# Patient Record
Sex: Female | Born: 1965 | Race: White | Hispanic: No | Marital: Married | Smoking: Never smoker
Health system: Southern US, Community
[De-identification: ages and names within clinical notes are randomized; demographics above are authoritative.]

## PROBLEM LIST (undated history)

## (undated) DIAGNOSIS — M47812 Spondylosis without myelopathy or radiculopathy, cervical region: Secondary | ICD-10-CM

## (undated) DIAGNOSIS — N200 Calculus of kidney: Secondary | ICD-10-CM

## (undated) DIAGNOSIS — M1990 Unspecified osteoarthritis, unspecified site: Secondary | ICD-10-CM

## (undated) DIAGNOSIS — F419 Anxiety disorder, unspecified: Secondary | ICD-10-CM

## (undated) HISTORY — DX: Calculus of kidney: N20.0

## (undated) HISTORY — DX: Spondylosis without myelopathy or radiculopathy, cervical region: M47.812

## (undated) HISTORY — DX: Unspecified osteoarthritis, unspecified site: M19.90

## (undated) HISTORY — DX: Anxiety disorder, unspecified: F41.9

## (undated) HISTORY — PX: TONSILLECTOMY: SUR1361

---

## 1998-07-03 ENCOUNTER — Inpatient Hospital Stay (HOSPITAL_COMMUNITY): Admission: AD | Admit: 1998-07-03 | Discharge: 1998-07-03 | Payer: Self-pay | Admitting: Family Medicine

## 1998-09-22 ENCOUNTER — Other Ambulatory Visit: Admission: RE | Admit: 1998-09-22 | Discharge: 1998-09-22 | Payer: Self-pay | Admitting: Obstetrics and Gynecology

## 1998-09-27 ENCOUNTER — Inpatient Hospital Stay (HOSPITAL_COMMUNITY): Admission: AD | Admit: 1998-09-27 | Discharge: 1998-09-29 | Payer: Self-pay | Admitting: Obstetrics & Gynecology

## 1999-03-20 ENCOUNTER — Emergency Department (HOSPITAL_COMMUNITY): Admission: EM | Admit: 1999-03-20 | Discharge: 1999-03-20 | Payer: Self-pay | Admitting: Emergency Medicine

## 1999-07-15 ENCOUNTER — Other Ambulatory Visit: Admission: RE | Admit: 1999-07-15 | Discharge: 1999-07-15 | Payer: Self-pay | Admitting: Obstetrics and Gynecology

## 2000-09-23 ENCOUNTER — Inpatient Hospital Stay (HOSPITAL_COMMUNITY): Admission: AD | Admit: 2000-09-23 | Discharge: 2000-09-23 | Payer: Self-pay | Admitting: Obstetrics and Gynecology

## 2000-10-26 ENCOUNTER — Inpatient Hospital Stay (HOSPITAL_COMMUNITY): Admission: AD | Admit: 2000-10-26 | Discharge: 2000-10-27 | Payer: Self-pay | Admitting: Obstetrics & Gynecology

## 2001-03-27 ENCOUNTER — Other Ambulatory Visit: Admission: RE | Admit: 2001-03-27 | Discharge: 2001-03-27 | Payer: Self-pay | Admitting: Obstetrics and Gynecology

## 2001-03-27 ENCOUNTER — Encounter (INDEPENDENT_AMBULATORY_CARE_PROVIDER_SITE_OTHER): Payer: Self-pay

## 2001-11-06 HISTORY — PX: ABDOMINAL HYSTERECTOMY: SHX81

## 2001-11-06 HISTORY — PX: BLADDER SUSPENSION: SHX72

## 2002-05-20 ENCOUNTER — Other Ambulatory Visit: Admission: RE | Admit: 2002-05-20 | Discharge: 2002-05-20 | Payer: Self-pay | Admitting: Obstetrics and Gynecology

## 2002-09-15 ENCOUNTER — Encounter (INDEPENDENT_AMBULATORY_CARE_PROVIDER_SITE_OTHER): Payer: Self-pay | Admitting: *Deleted

## 2002-09-15 ENCOUNTER — Inpatient Hospital Stay (HOSPITAL_COMMUNITY): Admission: RE | Admit: 2002-09-15 | Discharge: 2002-09-17 | Payer: Self-pay | Admitting: Obstetrics and Gynecology

## 2002-11-29 ENCOUNTER — Inpatient Hospital Stay (HOSPITAL_COMMUNITY): Admission: AD | Admit: 2002-11-29 | Discharge: 2002-11-29 | Payer: Self-pay | Admitting: Obstetrics and Gynecology

## 2002-12-14 ENCOUNTER — Inpatient Hospital Stay (HOSPITAL_COMMUNITY): Admission: AD | Admit: 2002-12-14 | Discharge: 2002-12-14 | Payer: Self-pay | Admitting: Obstetrics and Gynecology

## 2003-05-05 ENCOUNTER — Encounter (INDEPENDENT_AMBULATORY_CARE_PROVIDER_SITE_OTHER): Payer: Self-pay | Admitting: Specialist

## 2003-05-05 ENCOUNTER — Ambulatory Visit (HOSPITAL_BASED_OUTPATIENT_CLINIC_OR_DEPARTMENT_OTHER): Admission: RE | Admit: 2003-05-05 | Discharge: 2003-05-05 | Payer: Self-pay | Admitting: Otolaryngology

## 2004-08-05 ENCOUNTER — Encounter: Admission: RE | Admit: 2004-08-05 | Discharge: 2004-08-05 | Payer: Self-pay | Admitting: Family Medicine

## 2004-08-08 ENCOUNTER — Ambulatory Visit (HOSPITAL_COMMUNITY): Admission: RE | Admit: 2004-08-08 | Discharge: 2004-08-08 | Payer: Self-pay | Admitting: Family Medicine

## 2004-08-12 ENCOUNTER — Other Ambulatory Visit: Admission: RE | Admit: 2004-08-12 | Discharge: 2004-08-12 | Payer: Self-pay | Admitting: Obstetrics and Gynecology

## 2005-12-23 IMAGING — NM NM HEPATO W/GB/PHARM/[PERSON_NAME]
2 series · 12 of 12 positions shown · non-contrast
Comparison: none

CLINICAL DATA: Right upper quadrant pain after meal.  Suspect gallbladder disease. 
NUCLEAR MEDICINE HEPATOBILIARY WITH EJECTION FRACTION DETERMINATION
The patient was given 5.0 mCi of Technetium 99m tagged Choletec and anterior gamma camera views obtained in the right upper quadrant. The gallbladder is faintly seen at 10 minutes with increase in activity up to 50 minutes.  is noted in the small bowel at 40 minutes.  At one hour, the patient was given 1.2 mcg of CCK intravenously and ejection fraction determination was performed.  At 30 minutes, there is 73 percent ejection fraction noted with 71 percent at 60 minutes.  This is well within normal limits with normal value greater than 35 percent ejected at 30 minutes as the standard.  
IMPRESSION 
As above.

[he hepatobiliary · 3.22mm/px · 6 of 10 frames shown (1 of 2)]
[frame 1/10]
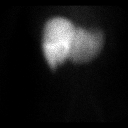
[frame 3/10]
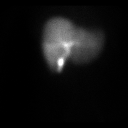
[frame 5/10]
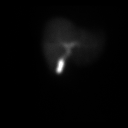
[frame 6/10]
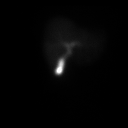
[frame 8/10]
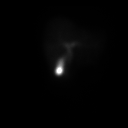
[frame 10/10]
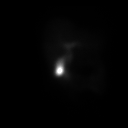

[he hepatobiliary · 3.22mm/px · 6 of 60 frames shown (2 of 2)]
[frame 6/60]
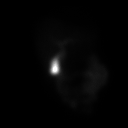
[frame 16/60]
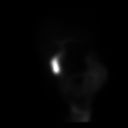
[frame 26/60]
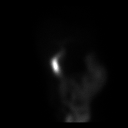
[frame 36/60]
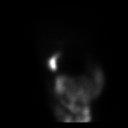
[frame 46/60]
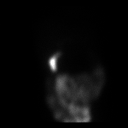
[frame 56/60]
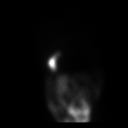

[12 of 12 positions shown; findings below may reference images not displayed]

## 2006-02-02 ENCOUNTER — Ambulatory Visit: Payer: Self-pay | Admitting: Internal Medicine

## 2006-05-28 ENCOUNTER — Ambulatory Visit: Payer: Self-pay | Admitting: Family Medicine

## 2008-03-10 DIAGNOSIS — L255 Unspecified contact dermatitis due to plants, except food: Secondary | ICD-10-CM | POA: Insufficient documentation

## 2008-03-13 ENCOUNTER — Ambulatory Visit: Payer: Self-pay | Admitting: Family Medicine

## 2008-08-17 ENCOUNTER — Ambulatory Visit: Payer: Self-pay | Admitting: Family Medicine

## 2008-08-17 ENCOUNTER — Ambulatory Visit: Payer: Self-pay | Admitting: Cardiology

## 2008-08-17 DIAGNOSIS — Z87442 Personal history of urinary calculi: Secondary | ICD-10-CM | POA: Insufficient documentation

## 2008-08-17 LAB — CONVERTED CEMR LAB
Bilirubin Urine: NEGATIVE
Blood in Urine, dipstick: NEGATIVE
Glucose, Urine, Semiquant: NEGATIVE
Ketones, urine, test strip: NEGATIVE
Nitrite: NEGATIVE
Protein, U semiquant: NEGATIVE
Specific Gravity, Urine: 1.005
Urobilinogen, UA: 0.2
WBC Urine, dipstick: NEGATIVE
pH: 8

## 2008-08-20 ENCOUNTER — Ambulatory Visit: Payer: Self-pay | Admitting: Family Medicine

## 2008-08-20 LAB — CONVERTED CEMR LAB
Bilirubin Urine: NEGATIVE
Blood in Urine, dipstick: NEGATIVE
Glucose, Urine, Semiquant: NEGATIVE
Ketones, urine, test strip: NEGATIVE
Nitrite: NEGATIVE
Protein, U semiquant: NEGATIVE
Specific Gravity, Urine: 1.015
Urobilinogen, UA: 0.2
WBC Urine, dipstick: NEGATIVE
pH: 6.5

## 2008-08-26 ENCOUNTER — Telehealth: Payer: Self-pay | Admitting: Family Medicine

## 2011-01-31 ENCOUNTER — Ambulatory Visit (INDEPENDENT_AMBULATORY_CARE_PROVIDER_SITE_OTHER): Payer: 59 | Admitting: Family Medicine

## 2011-01-31 ENCOUNTER — Encounter: Payer: Self-pay | Admitting: Family Medicine

## 2011-01-31 VITALS — BP 130/92 | Temp 98.4°F | Ht 67.0 in | Wt 150.0 lb

## 2011-01-31 DIAGNOSIS — T25029A Burn of unspecified degree of unspecified foot, initial encounter: Secondary | ICD-10-CM

## 2011-01-31 MED ORDER — OXYCODONE-ACETAMINOPHEN 5-325 MG PO TABS: 1.0000 | ORAL_TABLET | ORAL | Status: AC | PRN
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
## 2011-02-03 ENCOUNTER — Telehealth: Payer: Self-pay | Admitting: *Deleted

## 2011-02-03 MED ORDER — SILVER SULFADIAZINE 1 % EX CREA: TOPICAL_CREAM | Freq: Every day | CUTANEOUS | Status: DC

## 2011-02-03 NOTE — Telephone Encounter (Signed)
rx called in and patient is aware 

## 2011-02-08 ENCOUNTER — Telehealth: Payer: Self-pay | Admitting: *Deleted
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
## 2011-02-15 ENCOUNTER — Other Ambulatory Visit: Payer: Self-pay | Admitting: Family Medicine

## 2011-02-16 NOTE — Telephone Encounter (Signed)
Is this okay to fill? 
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
## 2011-02-28 ENCOUNTER — Other Ambulatory Visit: Payer: Self-pay | Admitting: Obstetrics and Gynecology

## 2011-02-28 DIAGNOSIS — R928 Other abnormal and inconclusive findings on diagnostic imaging of breast: Secondary | ICD-10-CM

## 2011-03-02 ENCOUNTER — Ambulatory Visit
Admission: RE | Admit: 2011-03-02 | Discharge: 2011-03-02 | Disposition: A | Discharge status: Home / Self Care | Payer: 59 | Source: Ambulatory Visit | Attending: Obstetrics and Gynecology | Admitting: Obstetrics and Gynecology

## 2011-03-02 DIAGNOSIS — R928 Other abnormal and inconclusive findings on diagnostic imaging of breast: Secondary | ICD-10-CM

## 2011-03-24 NOTE — Discharge Summary (Signed)
[facility]  Patient:    Caitlin Horn, Caitlin Horn                          MRN: [id number] Adm. Date:  10/26/00 Disc. Date: 10/27/00 Attending:  Caralyn Guile. Arlyce Dice, M.D. Dictator:   Leilani Able, P.A.                           Discharge Summary  FINAL DIAGNOSES:              1. Intrauterine pregnancy at term.                               2. Spontaneous vaginal delivery of a female                                  infant with Apgars of 9 and 9.  HOSPITAL COURSE:              This 46 year old G3, P1 presents at 40-5/[redacted] weeks gestation in active labor.  The patients prenatal course was complicated by advanced maternal age, and an amniocentesis was performed at 8 weeks, showing a female with normal chromosomes.  The rest of the patients prenatal course has been uncomplicated.  She is admitted at this time.  The patient had spontaneous vaginal delivery of an 8-pound 9-ounce female infant with Apgars of 9 and 9 over a second-degree peroneal laceration that was repaired.  Delivery went without complications.  The patients postpartum course was benign, without significant fevers.  She was felt ready for discharge on postpartum day #1.  Was sent home on a regular diet, told to decrease activities, was given Tylox #20 1-2 every four hours as needed for pain, also told she could use over-the-counter pain medicines.  To follow up in the office in four weeks. DD:  11/19/00 TD:  11/19/00 Job: [id number]

## 2011-03-24 NOTE — Op Note (Signed)
   NAME:  [name], [name]                           ACCOUNT NO.:  [id number]   MEDICAL RECORD NO.:  [id number]                   PATIENT TYPE:  AMB   LOCATION:  DSC                                  FACILITY:  [facility]   PHYSICIAN:  Christopher E. Ezzard Standing, M.D.         DATE OF BIRTH:  Aug 08, 1966   DATE OF PROCEDURE:  05/05/2003  DATE OF DISCHARGE:                                 OPERATIVE REPORT   PREOPERATIVE DIAGNOSIS:  Chronic cryptic tonsillitis.   POSTOPERATIVE DIAGNOSIS:  Chronic cryptic tonsillitis.   OPERATION:  Tonsillectomy.   SURGEON:  Kristine Garbe. Ezzard Standing, M.D.   ANESTHESIA:  General endotracheal.   COMPLICATIONS:  None.   BRIEF CLINICAL NOTE:  Caitlin Horn is a 45 year old female who has had  chronic problems with debris collecting in the tonsil crypts as well as  frequent sore throats.  The left side has been worse than the right.  On  examination, she has chronic cryptic tonsils, 2+.  She is taken to the  operating room at this time for tonsillectomy.   DESCRIPTION OF PROCEDURE:  After adequate endotracheal anesthesia, the  patient received 8 mg of Decadron IV preoperatively as well as 1 g of Ancef  IV preoperatively.  A mouthgag was used to expose the oropharynx.  The  tonsils were resected from the tonsillar fossae using the cautery.  Care was  taken to preserve the anterior and posterior tonsillar pillars as well as  the uvula.  Hemostasis was obtained with cautery.  After obtaining adequate  hemostasis, the tonsillar fossae were irrigated with saline.  This completed  the procedure.  Caitlin Horn was awoken from anesthesia and transferred to the  recovery room postoperatively doing well.   DISPOSITION:  Caitlin Horn is discharged home later this morning on amoxicillin  suspension 400 mg b.i.d. for one week, Tylenol and Lortab elixir 15-30 cc  q.4 h. p.r.n. pain.  I will have her follow up in my office in two weeks for  recheck.     Kristine Garbe. Ezzard Standing, M.D.    CEN/MEDQ  D:  05/05/2003  T:  05/05/2003  Job:  [id number]   cc:   Tinnie Gens A. Tawanna Cooler, M.D. [facility]

## 2011-03-24 NOTE — Op Note (Signed)
NAME:  Caitlin Horn, Caitlin Horn NO.:  [id number]   MEDICAL RECORD NO.:  [id number]                   PATIENT TYPE:  INP   LOCATION:  9399                                 FACILITY:  [facility]   PHYSICIAN:  Randye Lobo, M.D.                DATE OF BIRTH:  [birth year]   DATE OF PROCEDURE:  09/15/2002  DATE OF DISCHARGE:                                 OPERATIVE REPORT   SURGEON:  Randye Lobo, M.D.   ASSISTANT:  Miguel Aschoff, M.D.   PREOPERATIVE DIAGNOSES:  1. Genuine stress incontinence.  2. Cystourethrocele.  3. Rectocele.   POSTOPERATIVE DIAGNOSES:  1. Genuine stress incontinence.  2. Cystourethrocele.  3. Rectocele.   PROCEDURE:  1. TVT urethral suspension.  2. Anterior colporrhaphy.  3. Posterior colporrhaphy.  4. Cystoscopy.  5. Suprapubic catheter placement.   ANESTHESIA:  Spinal epidural.   TOTAL IV FLUIDS:  1650 cc.   ESTIMATED BLOOD LOSS:  150 cc.   URINE OUTPUT:  150 cc.   COMPLICATIONS:  None.   INDICATIONS FOR PROCEDURE:  The patient was a 45 year old para 2 Caucasian  female who presented with a four year history of urinary incontinence which  was a social problem for her.  The patient declined any future childbearing  and wished for definitive surgical treatment of her incontinence.  She did  undergo preoperative multichannel urodynamic testing which documented the  presence of genuine stress incontinence, a stable cystometrogram.  On  physical examination the patient was noted to have evidence of a second to  third degree cystocele, first to second degree uterine prolapse, and a first  degree rectocele in the office.   The patient's primary gynecologist, Dr. Miguel Aschoff, discussed options with  the patient regarding treatment for the pelvic organ prolapse and a decision  was made to proceed with a combined case with both surgeons involved in  treatment of the prolapse and the incontinence.  The patient agreed to her  surgery  after the risks, benefits, and alternatives were discussed with her.   FINDINGS:  Examination under anesthesia revealed a second to third degree  cystocele, second degree uterine prolapse, and a second degree rectocele.   Cystoscopy documented the absence of foreign bodies in the bladder or the  urethra.  The bladder was visualized at 360 degrees.  The bladder dome,  trigone, and urethra were all noted to be normal.  The ureters were patent  bilaterally after the injection of indigo carmine dye intravenously.   PROCEDURE:  The patient was taken to the operating room after she was  properly identified in the preoperative hold area.  In the operating room  suite the patient did receive a spinal epidural and was then placed in the  dorsal lithotomy position.  The patient was then sterilely prepped and  draped and the total vaginal hysterectomy was performed under the direction  of  Dr. Miguel Aschoff.  Please refer to this dictation separately.  After the  total vaginal hysterectomy with culdoplasty procedure I proceeded with the  remainder of the prolapse and urinary incontinence repair.   A Foley catheter was placed inside the bladder which was emptied of all  urine.  The posterior vaginal cuff had been closed with a running locked  suture and the peritoneum was closed in a purse-string type closure.   The anterior colporrhaphy was then performed with the TVT procedure.  Allis  clamps were placed on the vaginal mucosa marking the midline of the anterior  vaginal wall.  This was then injected with 0.5% lidocaine with 1:100,000 of  epinephrine.  The midline of the vagina was then incised in a vertical  fashion.  The subvaginal tissue was then dissected off of the underlying  bladder using a combination of sharp and blunt dissection.  The dissection  was carried through the pubic rami bilaterally.  This space of Retzius was  not entered at this time.   A small puncture incisions were then  created suprapubically to the right and  to the left of the midline using a scalpel.  The abdominal passer was then  placed through the space of Retzius and into the vagina below at the level  of the proximal mid urethra.  The same procedure that was performed on the  patient's right-hand side was repeated on the left-hand side.  The Foley  catheter was removed at this time and cystoscopy was performed.  The  findings were as noted above.  The TVT was then secured to the abdominal  passer in the appropriate fashion and the mesh was drawn up through the  suprapubic incision sites bilaterally.  The mesh was adjusted into the  proper location at this time and the cystoscopy was once again performed.  There was no evidence of any foreign body in the urethra or the bladder.  The surrounding plastic guide which covered the mesh was then removed and  the mesh was trimmed suprapubically.  The cystocele was next reduced by  placing vertical mattress sutures of 0 Vicryl for excellent reduction of the  cystocele.  The anterior vaginal wall including the site from the  hysterectomy was then closed using a running locked suture of 2-0 Vicryl.  The Foley catheter was replaced inside the urinary bladder at this time.   The posterior colporrhaphy was performed next.  Allis clamps marked the  hymen bilaterally and the midline of the vagina midway up to the vaginal  apex.  The subvaginal tissue was again injected with 0.5% lidocaine with  epinephrine.  A wedge shaped specimen of tissue was then sharply excised  from the perineal body and the vagina was incised in the midline using the  Mayo scissors.  The perirectal fascia was then dissected off of the vaginal  mucosa and the posterior colporrhaphy sutures of 0 Vicryl were placed in an  interrupted and in a vertical mattress fashion.  This provided excellent  reduction of the rectocele.  Excess vaginal mucosa was trimmed away and the posterior vagina was  then closed with a running locked suture of 2-0 Vicryl.  This was continued along the perineum for closure of an episiotomy.   At this time the bladder was retrograde filled with 500 cc of crystalloid  solution and the patient was placed in the Trendelenburg position.  A  suprapubic Bonanno catheter was then placed into the urinary bladder and a  syringe  was used to withdraw fluid to assure proper location in the bladder.  The suprapubic catheter was then secured to the skin with 3-0 Ethilon  suture.  The Foley catheter had been removed for placement of the suprapubic  catheter and was replaced at this time.  Both catheters were placed to  gravity drainage.  The suprapubic incisions were closed using Dermabond and  a sterile bandage was placed over the suprapubic catheter site.  An iodoform  gauze packing was placed inside the vagina and a rectal examination at this  time confirmed the absence of sutures in the rectum.   The patient was cleansed of any Betadine and was then escorted to the  recovery room in stable and awake condition.  There were no complications to  the procedure.  All needle, instrument, and sponge counts were correct.                                               Randye Lobo, M.D.    BES/MEDQ  D:  09/15/2002  T:  09/15/2002  Job:  [id number]

## 2011-03-24 NOTE — Op Note (Signed)
NAME:  Caitlin Horn, Caitlin Horn                           ACCOUNT NO.:  [id number]   MEDICAL RECORD NO.:  [id number]                   PATIENT TYPE:  INP   LOCATION:  9399                                 FACILITY:  [facility]   PHYSICIAN:  Miguel Aschoff, M.D.                    DATE OF BIRTH:  September 14, 1966   DATE OF PROCEDURE:  09/15/2002  DATE OF DISCHARGE:                                 OPERATIVE REPORT   PREOPERATIVE DIAGNOSES:  Uterine descensus.   POSTOPERATIVE DIAGNOSES:  Uterine descensus.   PROCEDURE:  Total vaginal hysterectomy.   SURGEON:  Miguel Aschoff, M.D.   ASSISTANT:  Randye Lobo, M.D.   ANESTHESIA:  Spinal and epidural.   COMPLICATIONS:  None.   JUSTIFICATION:  The patient is a 45 year old white female with a history of  uterine descensus.  Also noted to have a symptomatic urethrocele, cystocele,  and rectocele.  The patient has been evaluated by Dr. Conley Simmonds from a  urologic standpoint and is to have repair of the cystocele, rectocele, and  urethrocele following the hysterectomy.   PROCEDURE:  The patient was taken to the operating room and placed in supine  position after spinal anesthesia had been placed without difficulty along  with an epidural catheter.  She was placed in the dorsal lithotomy position,  prepped and draped in the usual sterile fashion.  The bladder was  catheterized.  Speculum was placed in the vaginal vault.  Cervix was then  injected with 1% Xylocaine with 1:100,000 of epinephrine.  The cervical  mucosa was then incised.  The uterine dissection was carried out anteriorly  and posteriorly until the peritoneal reflections were identified.  Peritoneum was entered posteriorly.  Once this was done using curved Heaney  clamps uterosacral ligaments were clamped, cut, and suture ligated using  suture ligatures of 0 Vicryl.  The cardinal ligaments were then clamped,  cut, and suture ligated in a similar fashion.  Additional bites were taken  of the broad  ligament structures with curved Heaney clamps.  All pedicles  were suture ligated using suture ligatures of 0 Vicryl.  The peritoneum was  then entered anteriorly, then the uterine vessels were identified, clamped,  cut, and suture ligated using suture ligatures of 0 Vicryl.  Additional  bites were taken of the broad ligament structures using curved Heaney  clamps, then all pedicles were suture ligated using suture ligatures of 0  Vicryl.  At this point it was possible to deliver the uterine fundus through  the cul-de-sac.  The residual structures which included the tube, round  ligament, utero-ovarian ligament, and additional broad ligament structures  were clamped with curved Heaney clamp bilaterally and then specimen was  excised.  The large pedicles were doubly ligated first with suture ligatures  of 0 Vicryl and then with free ties of 0 Vicryl.  These pedicles were held.  At this  point inspection was made.  Pedicles appeared to be hemostatically  secure.  The cul-de-sac was somewhat reduced using purse-string sutures of 0  Vicryl and then the posterior vaginal cuff was run using running  interlocking 0 Vicryl suture.  After lap counts were taken and found to  be correct, the peritoneum was closed using a purse-string suture of 0  Vicryl and then the uterosacral ligaments were approximated using  interrupted 0 Vicryl suture.  At this point Dr. Conley Simmonds proceeded with  the TVT urethral suspension and anterior and posterior colporrhaphy.  These  are dictated as separate notes.                                               Miguel Aschoff, M.D.    AR/MEDQ  D:  09/15/2002  T:  09/15/2002  Job:  [id number]

## 2011-03-24 NOTE — Discharge Summary (Signed)
NAME:  Caitlin Horn, Caitlin Horn NO.:  [id number]   MEDICAL RECORD NO.:  [id number]                   PATIENT TYPE:   LOCATION:                                       FACILITY:   PHYSICIAN:  Miguel Aschoff, M.D.                    DATE OF BIRTH:   DATE OF ADMISSION:  09/15/2002  DATE OF DISCHARGE:  09/17/2002                                 DISCHARGE SUMMARY   ADMISSION DIAGNOSES:  1. Urinary stress incontinence.  2. Uterine prolapse.   POSTOPERATIVE DIAGNOSES:  1. Urinary stress incontinence.  2. Uterine prolapse.   OPERATION/PROCEDURE:  1. Total vaginal hysterectomy.  2. Anterior and posterior colporrhaphy.  3. TVT urethral sling.  4. Cystoscopy.  5. Suprapubic catheter placement.   HISTORY OF PRESENT ILLNESS:  The patient is a 45 year old white female who  has a history of pelvic pressure and pain and symptomatic uterine descensus.  She is also noted to have a symptomatic cystocele and rectocele.  The  patient was previously evaluated by Dr. Conley Simmonds urodynamically and was  proven to have urinary stress incontinence.  Due to the pelvic organ  prolapse and urinary stress incontinence, the patient was admitted to  undergo definitive procedures for this problem.  Preoperative studies were  obtained.  Her admission hemoglobin was 15, white count was 7300.  Chemistry  profile showed slight elevation in calcium at 10.6, other values were  negative.   HOSPITAL COURSE:  Under spinal and epidural anesthesia, the patient  underwent a total vaginal hysterectomy by Dr. Tenny Craw and then Dr. Edward Jolly  proceeded to perform a TVT urethral suspension as well as an anterior and  posterior colporrhaphy as well as cystoscopy and suprapubic catheter  placement.  The patient tolerated the procedures well.  Her postoperative  course was essentially uncomplicated.  She tolerated increasing ambulation  and diet well.  The patient was in stable enough condition to be sent home  on September 17, 2002.  She was voiding spontaneously with only 50 cc  residual.   DISPOSITION:  She was discharged home and is to see Dr. Edward Jolly in one week  for follow-up examination on the bladder repair and was instructed to see me  in four weeks for follow-up examination regarding the hysterectomy.   DISCHARGE MEDICATIONS:  1. Tylox every three hours as needed for pain.  2. Motrin 600 mg p.o. q.6h. as needed for pain.   DISCHARGE INSTRUCTIONS:  She was instructed on no heavy lifting, place  nothing in the vagina and to call if there are any problems such as fever,  pain or heavy bleeding.    PATHOLOGY:  The uterus was 8 x 5 x 4 cm which had benign secretory  endometrium.  There was extensive adenomyosis.  Cervix showed inflammation.  Miguel Aschoff, M.D.    AR/MEDQ  D:  11/12/2002  T:  11/12/2002  Job:  [id number]

## 2011-03-24 NOTE — H&P (Signed)
NAME:  Caitlin Horn, Caitlin Horn                           ACCOUNT NO.:  [id number]   MEDICAL RECORD NO.:  [id number]                   PATIENT TYPE:  INP   LOCATION:  NA                                   FACILITY:  [facility]   PHYSICIAN:  Randye Lobo, M.D.                DATE OF BIRTH:  03-19-1966   DATE OF ADMISSION:  DATE OF DISCHARGE:                                HISTORY & PHYSICAL   CHIEF COMPLAINT:  Urinary incontinence.   HISTORY OF PRESENT ILLNESS:  The patient is a 45 year old, para 2, Caucasian  female, who presented in September, stating a four-year history of urinary  incontinence since the birth of her first child.  The patient reports  episodes of urinary incontinence occurring without notice.  She also reports  urinary urgency and she wears a pad all of the time.  The patient does  report a history of double voiding.  She denies a history of fecal  incontinence.  She has occasional constipation.   The patient declines any future childbearing and currently uses a vasectomy  for birth control.  The patient wishes for definite surgical treatment of  her urinary incontinence.   PAST OBSTETRICAL AND GYNECOLOGICAL HISTORY:  Remarkable for two prior  vaginal deliveries.  The patient did have a forceps delivery with her first  delivery.  The patient's last Pap smear was performed on May 20, 2002, and  was within normal limits.  Her last mammogram was performed on May 20, 2002, and was also within normal limits.  The patient currently using a  vasectomy for birth control.   PAST MEDICAL HISTORY:  Significant for:  1. Headaches for which the patient uses Imitrex nasal spray on a p.r.n.     basis.  2. History of nephrolithiasis x 2.  The patient did pass one kidney stone     after she had catheter placement.  The second passed spontaneously.  3. History of urinary tract infections as a young person.  She denies a     history of pyelonephritis.   MEDICATIONS:  Imitrex nasal spray  p.r.n.   ALLERGIES:  PRILOSEC and DIFLUCAN.   SOCIAL HISTORY:  The patient is married.  She denies the use of tobacco.   PHYSICAL EXAMINATION:  VITAL SIGNS:  The blood pressure is 110/70.  WEIGHT:  144 pounds.  GENERAL APPEARANCE:  The patient is a young Caucasian female in no acute  distress.  HEENT:  Normocephalic and atraumatic.  NECK:  Negative for adenopathy and thyromegaly.  LUNGS:  Clear to auscultation bilaterally.  HEART:  S1 and S2 with a regular rate and rhythm.  ABDOMEN:  Soft and nontender and without hepatosplenomegaly or organomegaly.  PELVIC:  Normal external genitalia.  The urethra has a deflection of +45-+80  degrees with a Q-Tip.  There is evidence of a second degree cystocele, first  degree uterine prolapse, and  a first degree rectocele.  The uterus is noted  to be small and nontender.  There are no adnexal masses or tenderness  appreciated.   Multichannel urodynamic testing performed on August 05, 2002, documented  the presence of a stable CMG with evidence of genuine stress incontinence.  The patient had an intermittent voiding pattern and a postvoid residual of 5  cc.   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  The patient declines any future childbearing.   PLAN:  The patient will undergo a total vaginal hysterectomy with anterior  and posterior colporrhaphy, TVP with cystoscopy, and suprapubic catheter  placement at the [facility] on September 16, 2002, under the direction  of Randye Lobo, M.D., and Miguel Aschoff, M.D.  The risks, benefits, and  alternatives have been discussed with the patient, who wishes to proceed.                                               Randye Lobo, M.D.    BES/MEDQ  D:  09/14/2002  T:  09/14/2002  Job:  [id number]

## 2012-01-09 ENCOUNTER — Encounter: Payer: Self-pay | Admitting: Family Medicine

## 2012-01-09 ENCOUNTER — Ambulatory Visit (INDEPENDENT_AMBULATORY_CARE_PROVIDER_SITE_OTHER): Payer: 59 | Admitting: Family Medicine

## 2012-01-09 DIAGNOSIS — F068 Other specified mental disorders due to known physiological condition: Secondary | ICD-10-CM

## 2012-01-09 DIAGNOSIS — F064 Anxiety disorder due to known physiological condition: Secondary | ICD-10-CM

## 2012-01-09 DIAGNOSIS — J3081 Allergic rhinitis due to animal (cat) (dog) hair and dander: Secondary | ICD-10-CM

## 2012-01-09 MED ORDER — PREDNISONE 20 MG PO TABS: ORAL_TABLET | ORAL | Status: DC

## 2012-01-09 MED ORDER — LORAZEPAM 0.5 MG PO TABS: 0.5000 mg | ORAL_TABLET | Freq: Two times a day (BID) | ORAL | Status: AC | PRN

## 2012-01-09 NOTE — Progress Notes (Signed)
  Subjective:    Patient ID: Caitlin Horn, female    DOB: September 20, 1966, 46 y.o.   MRN: [id number]  HPI Caitlin Horn is a 46 year old married female G2 P84 who is 23-year-old daughter has leukemia first diagnosed 3 years ago now in remission who comes in today with a 4 week history of allergic rhinitis  They live on [place] farm up in [place] and they have horses. She's noticed a headache as mold in it. This is probably what triggered her allergies. She had head congestion watery eyes nasal edema cough no wheezing. She's tried over-the-counter medicines to no avail. She's had her uterus removed for nonmalignant reasons ovaries were left intact.  She's had a history of muscle twitching from Vicodin and Percocet   Review of Systems    general and pulmonary review of systems otherwise negative Objective:   Physical Exam  Well-developed well-nourished female in no acute distress HEENT negative except for 4+ nasal edema neck was supple no adenopathy lungs are clear      Assessment & Plan:  Allergic rhinitis plan avoid moldy had a prednisone burst and taper return when necessary

## 2012-01-09 NOTE — Patient Instructions (Signed)
Take the prednisone as directed  Avoid moldy headache  Ativan 0.5 directions 1 tab at bedtime.  Return.

## 2012-01-10 ENCOUNTER — Other Ambulatory Visit: Payer: Self-pay | Admitting: *Deleted

## 2012-01-10 DIAGNOSIS — J3081 Allergic rhinitis due to animal (cat) (dog) hair and dander: Secondary | ICD-10-CM

## 2012-01-10 MED ORDER — PREDNISONE 20 MG PO TABS: ORAL_TABLET | ORAL | Status: DC

## 2012-03-15 ENCOUNTER — Encounter: Payer: Self-pay | Admitting: Gastroenterology

## 2012-09-17 ENCOUNTER — Encounter: Payer: Self-pay | Admitting: Family Medicine

## 2012-09-17 ENCOUNTER — Ambulatory Visit (INDEPENDENT_AMBULATORY_CARE_PROVIDER_SITE_OTHER): Payer: 59 | Admitting: Family Medicine

## 2012-09-17 DIAGNOSIS — J3081 Allergic rhinitis due to animal (cat) (dog) hair and dander: Secondary | ICD-10-CM

## 2012-09-17 DIAGNOSIS — J45909 Unspecified asthma, uncomplicated: Secondary | ICD-10-CM

## 2012-09-17 DIAGNOSIS — Z23 Encounter for immunization: Secondary | ICD-10-CM

## 2012-09-17 MED ORDER — HYDROCODONE-HOMATROPINE 5-1.5 MG/5ML PO SYRP: 5.0000 mL | ORAL_SOLUTION | Freq: Three times a day (TID) | ORAL | Status: DC | PRN

## 2012-09-17 MED ORDER — PREDNISONE 20 MG PO TABS: ORAL_TABLET | ORAL | Status: DC

## 2012-09-17 NOTE — Progress Notes (Signed)
  Subjective:    Patient ID: Caitlin Horn, female    DOB: 01/07/1966, 46 y.o.   MRN: [id number]  HPI Caitlin Horn is a 46 year old married female nonsmoker who comes in today with a cough for 2 weeks  She states she thinks she had a viral type infection about 2 weeks ago with a low-grade fever and a sore throat. Over the last 3-4 days the cough has gotten worse. She is unable to lie flat and sleep because of shortness of breath and coughing at bedtime.  She's had a history of allergic rhinitis   Review of Systems General and pulmonary review of systems otherwise negative    Objective:   Physical Exam Well-developed well-nourished female no acute distress HEENT negative neck was supple no adenopathy lungs are clear no wheezing       Assessment & Plan:  Reactive airway disease plan prednisone burst and taper Hydromet when necessary for cough return in one week

## 2012-09-17 NOTE — Patient Instructions (Signed)
Drink lots of water  Hydromet 1/2 teaspoon 3 times daily as a cough suppressant  Prednisone 3 tabs daily until symptoms begin to abate and then taper as outlined  Return in one week for followup sooner if any problems

## 2012-09-24 ENCOUNTER — Encounter: Payer: Self-pay | Admitting: Family Medicine

## 2012-09-24 ENCOUNTER — Ambulatory Visit (INDEPENDENT_AMBULATORY_CARE_PROVIDER_SITE_OTHER): Payer: 59 | Admitting: Family Medicine

## 2012-09-24 VITALS — BP 150/98 | Temp 98.3°F | Wt 165.0 lb

## 2012-09-24 DIAGNOSIS — J45909 Unspecified asthma, uncomplicated: Secondary | ICD-10-CM

## 2012-09-24 MED ORDER — HYDROCODONE-HOMATROPINE 5-1.5 MG/5ML PO SYRP: 5.0000 mL | ORAL_SOLUTION | Freq: Three times a day (TID) | ORAL | Status: DC | PRN

## 2012-09-24 MED ORDER — DOXYCYCLINE HYCLATE 100 MG PO TABS: 100.0000 mg | ORAL_TABLET | Freq: Two times a day (BID) | ORAL | Status: DC

## 2012-09-24 NOTE — Progress Notes (Signed)
  Subjective:    Patient ID: Caitlin Horn, female    DOB: January 08, 1966, 46 y.o.   MRN: [id number]  HPI Judene is a 46 year old married female nonsmoker who comes in today for followup of reactive airway disease  We saw her last week and a viral infection triggered her RAD. We started on prednisone 60 mg daily and she began to taper to 40 mg daily yesterday but now her cough seems worse. She feels like she is only about 47% better  No new symptoms except she had a chill this morning woke up soaking wet   Review of Systems General and pulmonary review of systems otherwise negative    Objective:   Physical Exam  Well-developed well-nourished female no acute distress she is afebrile  Pulmonary exam normal no wheezing      Assessment & Plan:

## 2012-09-24 NOTE — Patient Instructions (Signed)
Go back to the 3 prednisone tablets daily  Doxycycline 1 twice daily  Drink lots of water  Continue the Hydromet  Followup in one week

## 2012-10-01 ENCOUNTER — Ambulatory Visit: Payer: 59 | Admitting: Family Medicine

## 2012-11-18 ENCOUNTER — Telehealth: Payer: Self-pay | Admitting: Family Medicine

## 2012-11-18 NOTE — Telephone Encounter (Signed)
Patient Information:  Caller Name: Annamary  Phone: [phone]  Patient: Caitlin Horn, Caitlin Horn  Gender: Female  DOB: [birth year]  Age: 47 Years  PCP: Kelle Darting [facility])  Pregnant: No  Office Follow Up:  Does the office need to follow up with this patient?: No  Instructions For The Office: N/A   Symptoms  Reason For Call & Symptoms: Having trouble staying asleep right now.  Reviewed Health History In EMR: Yes  Reviewed Medications In EMR: Yes  Reviewed Allergies In EMR: Yes  Reviewed Surgeries / Procedures: Yes  Date of Onset of Symptoms: 11/06/2012  Treatments Tried: Zyquil, Melatonin, Calcium  Treatments Tried Worked: No OB / GYN:  LMP: Unknown  Guideline(s) Used:  No Protocol Available - Information Only  No Protocol Available - Sick Adult  Disposition Per Guideline:   See Today in Office  Reason For Disposition Reached:   Patient wants to be seen  Advice Given:  Call Back If:  New symptoms develop  You become worse.  Appointment Scheduled:  11/19/2012 12:15:00 Appointment Scheduled Provider:  Kelle Darting [facility])

## 2012-11-19 ENCOUNTER — Other Ambulatory Visit: Payer: Self-pay | Admitting: *Deleted

## 2012-11-19 ENCOUNTER — Encounter: Payer: Self-pay | Admitting: Family Medicine

## 2012-11-19 ENCOUNTER — Ambulatory Visit (INDEPENDENT_AMBULATORY_CARE_PROVIDER_SITE_OTHER): Payer: 59 | Admitting: Family Medicine

## 2012-11-19 VITALS — BP 168/102 | Temp 98.8°F | Wt 170.0 lb

## 2012-11-19 DIAGNOSIS — R030 Elevated blood-pressure reading, without diagnosis of hypertension: Secondary | ICD-10-CM | POA: Insufficient documentation

## 2012-11-19 DIAGNOSIS — J3081 Allergic rhinitis due to animal (cat) (dog) hair and dander: Secondary | ICD-10-CM

## 2012-11-19 DIAGNOSIS — G4700 Insomnia, unspecified: Secondary | ICD-10-CM | POA: Insufficient documentation

## 2012-11-19 MED ORDER — CLONAZEPAM 0.5 MG PO TABS: 0.5000 mg | ORAL_TABLET | Freq: Two times a day (BID) | ORAL | Status: DC | PRN

## 2012-11-19 NOTE — Patient Instructions (Signed)
Purchase a digital blood pressure cuff  Check your blood pressure 3 times daily  Return in one week for followup with all the data and the device  Klonopin 0.5,,,,,,,,,, one tablet at bedtime for sleep  Avoid caffeine

## 2012-11-19 NOTE — Progress Notes (Signed)
  Subjective:    Patient ID: Caitlin Horn, female    DOB: [birth year], 47 y.o.   MRN: [id number]  HPI Caitlin Horn is a 47 year old married female who comes in today for evaluation of insomnia  Her husband works for the [employer] company and has been in [place] and for couple months. He's had to leave twice before and every time he goes out of town she develops insomnia. She's tried different over-the-counter sleep aids to no avail.  Her BP today was 168/102 right arm sitting position repeat by me 180/110 she's never had elevated blood pressure in the past. Family history negative for hypertension. She has been drinking a lot of caffeine.   Review of Systems    general review of systems otherwise negative Objective:   Physical Exam Well-developed well-nourished female no acute distress BP right arm sitting position 180/110 cardiac exam normal abdominal exam normal no renal bruits       Assessment & Plan:  Insomnia begin Klonopin 0.5 each bedtime

## 2012-11-26 ENCOUNTER — Ambulatory Visit: Payer: 59 | Admitting: Family Medicine

## 2012-12-03 ENCOUNTER — Ambulatory Visit: Payer: 59 | Admitting: Family Medicine

## 2013-03-28 ENCOUNTER — Other Ambulatory Visit: Payer: Self-pay | Admitting: Obstetrics and Gynecology

## 2014-04-07 ENCOUNTER — Encounter: Payer: Self-pay | Admitting: Family Medicine

## 2014-04-07 ENCOUNTER — Ambulatory Visit (INDEPENDENT_AMBULATORY_CARE_PROVIDER_SITE_OTHER): Payer: BC Managed Care – PPO | Admitting: Family Medicine

## 2014-04-07 VITALS — BP 150/100 | Temp 98.6°F | Wt 171.0 lb

## 2014-04-07 DIAGNOSIS — L989 Disorder of the skin and subcutaneous tissue, unspecified: Secondary | ICD-10-CM | POA: Insufficient documentation

## 2014-04-07 NOTE — Progress Notes (Signed)
   Subjective:    Patient ID: Caitlin Horn, female    DOB: 05/29/1966, 48 y.o.   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 Horatio Pel hand surgeon

## 2014-04-07 NOTE — Progress Notes (Signed)
Pre visit review using our clinic review tool, if applicable. No additional management support is needed unless otherwise documented below in the visit note. 

## 2014-04-07 NOTE — Patient Instructions (Signed)
Call Dr. Waymon Budge at the [facility] for consult

## 2015-11-07 DIAGNOSIS — M47812 Spondylosis without myelopathy or radiculopathy, cervical region: Secondary | ICD-10-CM

## 2015-11-07 DIAGNOSIS — M1990 Unspecified osteoarthritis, unspecified site: Secondary | ICD-10-CM

## 2015-11-07 HISTORY — DX: Spondylosis without myelopathy or radiculopathy, cervical region: M47.812

## 2015-11-07 HISTORY — DX: Unspecified osteoarthritis, unspecified site: M19.90

## 2016-04-21 ENCOUNTER — Ambulatory Visit (INDEPENDENT_AMBULATORY_CARE_PROVIDER_SITE_OTHER): Payer: BLUE CROSS/BLUE SHIELD | Admitting: Obstetrics and Gynecology

## 2016-04-21 ENCOUNTER — Encounter: Payer: Self-pay | Admitting: Obstetrics and Gynecology

## 2016-04-21 VITALS — BP 158/96 | HR 84 | Resp 16 | Ht 66.5 in | Wt 172.0 lb

## 2016-04-21 DIAGNOSIS — N816 Rectocele: Secondary | ICD-10-CM

## 2016-04-21 DIAGNOSIS — Z1211 Encounter for screening for malignant neoplasm of colon: Secondary | ICD-10-CM

## 2016-04-21 DIAGNOSIS — N3946 Mixed incontinence: Secondary | ICD-10-CM | POA: Diagnosis not present

## 2016-04-21 DIAGNOSIS — Z01419 Encounter for gynecological examination (general) (routine) without abnormal findings: Secondary | ICD-10-CM

## 2016-04-21 DIAGNOSIS — N951 Menopausal and female climacteric states: Secondary | ICD-10-CM

## 2016-04-21 MED ORDER — ALPRAZOLAM 0.25 MG PO TABS: 0.2500 mg | ORAL_TABLET | Freq: Three times a day (TID) | ORAL | Status: AC | PRN

## 2016-04-21 NOTE — Patient Instructions (Signed)
Health Maintenance, Female Adopting a healthy lifestyle and getting preventive care can go a long way to promote health and wellness. Talk with your health care provider about what schedule of regular examinations is right for you. This is a good chance for you to check in with your provider about disease prevention and staying healthy. In between checkups, there are plenty of things you can do on your own. Experts have done a lot of research about which lifestyle changes and preventive measures are most likely to keep you healthy. Ask your health care provider for more information. WEIGHT AND DIET  Eat a healthy diet  Be sure to include plenty of vegetables, fruits, low-fat dairy products, and lean protein.  Do not eat a lot of foods high in solid fats, added sugars, or salt.  Get regular exercise. This is one of the most important things you can do for your health.  Most adults should exercise for at least 150 minutes each week. The exercise should increase your heart rate and make you sweat (moderate-intensity exercise).  Most adults should also do strengthening exercises at least twice a week. This is in addition to the moderate-intensity exercise.  Maintain a healthy weight  Body mass index (BMI) is a measurement that can be used to identify possible weight problems. It estimates body fat based on height and weight. Your health care provider can help determine your BMI and help you achieve or maintain a healthy weight.  For females 20 years of age and older:   A BMI below 18.5 is considered underweight.  A BMI of 18.5 to 24.9 is normal.  A BMI of 25 to 29.9 is considered overweight.  A BMI of 30 and above is considered obese.  Watch levels of cholesterol and blood lipids  You should start having your blood tested for lipids and cholesterol at 51 years of age, then have this test every 5 years.  You may need to have your cholesterol levels checked more often if:  Your lipid  or cholesterol levels are high.  You are older than 50 years of age.  You are at high risk for heart disease.  CANCER SCREENING   Lung Cancer  Lung cancer screening is recommended for adults 55-80 years old who are at high risk for lung cancer because of a history of smoking.  A yearly low-dose CT scan of the lungs is recommended for people who:  Currently smoke.  Have quit within the past 15 years.  Have at least a 30-pack-year history of smoking. A pack year is smoking an average of one pack of cigarettes a day for 1 year.  Yearly screening should continue until it has been 15 years since you quit.  Yearly screening should stop if you develop a health problem that would prevent you from having lung cancer treatment.  Breast Cancer  Practice breast self-awareness. This means understanding how your breasts normally appear and feel.  It also means doing regular breast self-exams. Let your health care provider know about any changes, no matter how small.  If you are in your 20s or 30s, you should have a clinical breast exam (CBE) by a health care provider every 1-3 years as part of a regular health exam.  If you are 40 or older, have a CBE every year. Also consider having a breast X-ray (mammogram) every year.  If you have a family history of breast cancer, talk to your health care provider about genetic screening.  If you   are at high risk for breast cancer, talk to your health care provider about having an MRI and a mammogram every year.  Breast cancer gene (BRCA) assessment is recommended for women who have family members with BRCA-related cancers. BRCA-related cancers include:  Breast.  Ovarian.  Tubal.  Peritoneal cancers.  Results of the assessment will determine the need for genetic counseling and BRCA1 and BRCA2 testing. Cervical Cancer Your health care provider may recommend that you be screened regularly for cancer of the pelvic organs (ovaries, uterus, and  vagina). This screening involves a pelvic examination, including checking for microscopic changes to the surface of your cervix (Pap test). You may be encouraged to have this screening done every 3 years, beginning at age 21.  For women ages 30-65, health care providers may recommend pelvic exams and Pap testing every 3 years, or they may recommend the Pap and pelvic exam, combined with testing for human papilloma virus (HPV), every 5 years. Some types of HPV increase your risk of cervical cancer. Testing for HPV may also be done on women of any age with unclear Pap test results.  Other health care providers may not recommend any screening for nonpregnant women who are considered low risk for pelvic cancer and who do not have symptoms. Ask your health care provider if a screening pelvic exam is right for you.  If you have had past treatment for cervical cancer or a condition that could lead to cancer, you need Pap tests and screening for cancer for at least 20 years after your treatment. If Pap tests have been discontinued, your risk factors (such as having a new sexual partner) need to be reassessed to determine if screening should resume. Some women have medical problems that increase the chance of getting cervical cancer. In these cases, your health care provider may recommend more frequent screening and Pap tests. Colorectal Cancer  This type of cancer can be detected and often prevented.  Routine colorectal cancer screening usually begins at 50 years of age and continues through 51 years of age.  Your health care provider may recommend screening at an earlier age if you have risk factors for colon cancer.  Your health care provider may also recommend using home test kits to check for hidden blood in the stool.  A small camera at the end of a tube can be used to examine your colon directly (sigmoidoscopy or colonoscopy). This is done to check for the earliest forms of colorectal  cancer.  Routine screening usually begins at age 50.  Direct examination of the colon should be repeated every 5-10 years through 51 years of age. However, you may need to be screened more often if early forms of precancerous polyps or small growths are found. Skin Cancer  Check your skin from head to toe regularly.  Tell your health care provider about any new moles or changes in moles, especially if there is a change in a mole's shape or color.  Also tell your health care provider if you have a mole that is larger than the size of a pencil eraser.  Always use sunscreen. Apply sunscreen liberally and repeatedly throughout the day.  Protect yourself by wearing long sleeves, pants, a wide-brimmed hat, and sunglasses whenever you are outside. HEART DISEASE, DIABETES, AND HIGH BLOOD PRESSURE   High blood pressure causes heart disease and increases the risk of stroke. High blood pressure is more likely to develop in:  People who have blood pressure in the high end   of the normal range (130-139/85-89 mm Hg).  People who are overweight or obese.  People who are African American.  If you are 38-23 years of age, have your blood pressure checked every 3-5 years. If you are 61 years of age or older, have your blood pressure checked every year. You should have your blood pressure measured twice--once when you are at a hospital or clinic, and once when you are not at a hospital or clinic. Record the average of the two measurements. To check your blood pressure when you are not at a hospital or clinic, you can use:  An automated blood pressure machine at a pharmacy.  A home blood pressure monitor.  If you are between 45 years and 39 years old, ask your health care provider if you should take aspirin to prevent strokes.  Have regular diabetes screenings. This involves taking a blood sample to check your fasting blood sugar level.  If you are at a normal weight and have a low risk for diabetes,  have this test once every three years after 51 years of age.  If you are overweight and have a high risk for diabetes, consider being tested at a younger age or more often. PREVENTING INFECTION  Hepatitis B  If you have a higher risk for hepatitis B, you should be screened for this virus. You are considered at high risk for hepatitis B if:  You were born in a country where hepatitis B is common. Ask your health care provider which countries are considered high risk.  Your parents were born in a high-risk country, and you have not been immunized against hepatitis B (hepatitis B vaccine).  You have HIV or AIDS.  You use needles to inject street drugs.  You live with someone who has hepatitis B.  You have had sex with someone who has hepatitis B.  You get hemodialysis treatment.  You take certain medicines for conditions, including cancer, organ transplantation, and autoimmune conditions. Hepatitis C  Blood testing is recommended for:  Everyone born from [birth year] through 1966.  Anyone with known risk factors for hepatitis C. Sexually transmitted infections (STIs)  You should be screened for sexually transmitted infections (STIs) including gonorrhea and chlamydia if:  You are sexually active and are younger than 51 years of age.  You are older than 51 years of age and your health care provider tells you that you are at risk for this type of infection.  Your sexual activity has changed since you were last screened and you are at an increased risk for chlamydia or gonorrhea. Ask your health care provider if you are at risk.  If you do not have HIV, but are at risk, it may be recommended that you take a prescription medicine daily to prevent HIV infection. This is called pre-exposure prophylaxis (PrEP). You are considered at risk if:  You are sexually active and do not regularly use condoms or know the HIV status of your partner(s).  You take drugs by injection.  You are sexually  active with a partner who has HIV. Talk with your health care provider about whether you are at high risk of being infected with HIV. If you choose to begin PrEP, you should first be tested for HIV. You should then be tested every 3 months for as long as you are taking PrEP.  PREGNANCY   If you are premenopausal and you may become pregnant, ask your health care provider about preconception counseling.  If you may  become pregnant, take 400 to 800 micrograms (mcg) of folic acid every day.  If you want to prevent pregnancy, talk to your health care provider about birth control (contraception). OSTEOPOROSIS AND MENOPAUSE   Osteoporosis is a disease in which the bones lose minerals and strength with aging. This can result in serious bone fractures. Your risk for osteoporosis can be identified using a bone density scan.  If you are 61 years of age or older, or if you are at risk for osteoporosis and fractures, ask your health care provider if you should be screened.  Ask your health care provider whether you should take a calcium or vitamin D supplement to lower your risk for osteoporosis.  Menopause may have certain physical symptoms and risks.  Hormone replacement therapy may reduce some of these symptoms and risks. Talk to your health care provider about whether hormone replacement therapy is right for you.  HOME CARE INSTRUCTIONS   Schedule regular health, dental, and eye exams.  Stay current with your immunizations.   Do not use any tobacco products including cigarettes, chewing tobacco, or electronic cigarettes.  If you are pregnant, do not drink alcohol.  If you are breastfeeding, limit how much and how often you drink alcohol.  Limit alcohol intake to no more than 1 drink per day for nonpregnant women. One drink equals 12 ounces of beer, 5 ounces of wine, or 1 ounces of hard liquor.  Do not use street drugs.  Do not share needles.  Ask your health care provider for help if  you need support or information about quitting drugs.  Tell your health care provider if you often feel depressed.  Tell your health care provider if you have ever been abused or do not feel safe at home.   This information is not intended to replace advice given to you by your health care provider. Make sure you discuss any questions you have with your health care provider.   Document Released: 05/08/2011 Document Revised: 11/13/2014 Document Reviewed: 09/24/2013 Elsevier Interactive Patient Education [employer].

## 2016-04-21 NOTE — Progress Notes (Signed)
Patient ID: Caitlin Horn, female   DOB: 1966/03/21, 50 y.o.   MRN: [id number] 50 y.o. [id number] Married Caucasian female here for annual exam.    Having hot flashes and night sweats.  Considering options.   Occasional leakage of urine with cough, sneeze.  Very little. Sometimes does not get to bathroom on time.   Having arthritis in neck.  Will start Meloxicam.  Has limited physical activity due to this. Starting physical therapy.  Takes Xanax periodically.  Would like a refill. Uses rarely.   PCP:  None  No LMP recorded. Patient has had a hysterectomy.           Sexually active: Yes.   female The current method of family planning is status post hysterectomy--ovaries remain.    Exercising: No.   Smoker:  no  Health Maintenance: Pap:  Years ago--Hysterectomy History of abnormal Pap:  no MMG:  04/2015 normal per patient--[facility] Colonoscopy:  NEVER BMD:   n/a  Result  n/a TDaP:  Years ago Gardasil:   NA Screening Labs:  Hb today: declines, Urine today: unable to void   reports that she has never smoked. She does not have any smokeless tobacco history on file. She reports that she drinks about 1.2 - 1.8 oz of alcohol per week. She reports that she does not use illicit drugs.  Past Medical History  Diagnosis Date  . Nephrolithiasis   . Anxiety   . 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 DJD (degenerative joint disease) of cervical spine 2017    [facility]    Past Surgical History  Procedure Laterality Date  . Bladder suspension  2003    Dr. Edward Jolly  . Abdominal hysterectomy  2003    and bladder suspension--Dr. Miguel Aschoff.  Ovaries remain.    Current Outpatient Prescriptions  Medication Sig Dispense Refill  . meloxicam (MOBIC) 7.5 MG tablet Take 1 tablet by mouth daily.    . Multiple Vitamin (MULTIVITAMIN) tablet Take 1 tablet by mouth daily.     No current facility-administered medications for this visit.    Family History  Problem Relation Age of  Onset  . Cancer Father     dec Lung ca age 29  . Pneumonia Mother     dec age 29 from pneumonia    ROS:  Pertinent items are noted in HPI.  Otherwise, a comprehensive ROS was negative.  Exam:   BP 158/96 mmHg  Pulse 84  Resp 16  Ht 5' 6.5" (1.689 m)  Wt 172 lb (78.019 kg)  BMI 27.35 kg/m2    General appearance: alert, cooperative and appears stated age Head: Normocephalic, without obvious abnormality, atraumatic Neck: no adenopathy, supple, symmetrical, trachea midline and thyroid normal to inspection and palpation Lungs: clear to auscultation bilaterally Breasts: normal appearance, no masses or tenderness, Inspection negative, No nipple retraction or dimpling, No nipple discharge or bleeding, No axillary or supraclavicular adenopathy Heart: regular rate and rhythm Abdomen: incisions:  Yes.     SP incisions , soft, non-tender; no masses, no organomegaly Extremities: extremities normal, atraumatic, no cyanosis or edema Skin: Skin color, texture, turgor normal. No rashes or lesions Lymph nodes: Cervical, supraclavicular, and axillary nodes normal. No abnormal inguinal nodes palpated Neurologic: Grossly normal  Pelvic: External genitalia:  no lesions.  Two suprapubic incisions - well healed.              Urethra:  normal appearing urethra with no masses, tenderness or lesions  Bartholins and Skenes: normal                 Vagina: normal appearing vagina with normal color and discharge, no lesions.  Second degree rectocele noted.  ?enterocele?  Anterior vaginal wall supported.              Cervix: absent              Pap taken: No. Bimanual Exam:  Uterus:  uterus absent              Adnexa: no mass, fullness, tenderness              Rectal exam: Yes.  .  Confirms.              Anus:  normal sphincter tone, no lesions  Chaperone was present for exam.  Assessment:   Well woman visit with normal exam. Status post TAH and bladder suspension.  Ovaries remain. Mixed  incontinence.  Rectocele.  Menopausal symptoms. HTN.  Anxiety DJD of cervical spine.   Plan: Yearly mammogram recommended after age 50. [facility]. Recommended self breast exam.  Pap and HR HPV as above. I discussed her rectocele with her and options including observation, pessary, and surgical correction.  She will do observation management.  Discussed Calcium, Vitamin D, regular exercise program including cardiovascular and weight bearing exercise. Labs performed.  No..   See orders. Prescription medication(s) given.  Yes.  .  See orders.  Xanax 0.25 mg po tid prn. #30, RF none.  She understands that she will receive refills from her PCP.  I discussed estrogen therapy, SSRIs/SNRIs, and herbal options.  Risks and benefits reviewed.  Patient will call with choice if she wishes for a prescription of estrogen or SSRI/SNRI.   TDap today. Colonoscopy referral.  List of PCPs for her to establish care. Follow up annually and prn.      After visit summary provided.

## 2016-04-23 ENCOUNTER — Encounter: Payer: Self-pay | Admitting: Obstetrics and Gynecology

## 2016-04-24 ENCOUNTER — Telehealth: Payer: Self-pay

## 2016-04-24 NOTE — Telephone Encounter (Signed)
Calling patient to verify if she received a Tdap on 04/21/16 at office visit?  Dr. Edward JollySilva had ordered, but don't see that it was given.  Left message for patient to call me back.

## 2016-04-28 ENCOUNTER — Telehealth: Payer: Self-pay | Admitting: Obstetrics and Gynecology

## 2016-04-28 NOTE — Telephone Encounter (Signed)
Left voicemail regarding referral appointment. The information is listed below. Should the patient need to cancel or reschedule this appointment, please advise them to call the office they've been referred to in order to reschedule.  [facility] [address]  [address] [place] [phone]  05/23/16 @3pm . Please arrive 15 minutes early with insurance card and photo id.

## 2016-05-05 NOTE — Telephone Encounter (Signed)
Patient did not receive Tdap 04-21-16, because not signed out in our log; Dr. Edward JollySilva cancelled order for patient.

## 2020-08-02 LAB — EXTERNAL GENERIC LAB PROCEDURE: COLOGUARD: NEGATIVE

## 2020-08-02 LAB — COLOGUARD: COLOGUARD: NEGATIVE

## 2023-09-22 LAB — EXTERNAL GENERIC LAB PROCEDURE: COLOGUARD: NEGATIVE

## 2023-09-22 LAB — COLOGUARD: COLOGUARD: NEGATIVE

## 2023-11-23 ENCOUNTER — Other Ambulatory Visit: Payer: Self-pay

## 2023-11-23 ENCOUNTER — Ambulatory Visit: Payer: BC Managed Care – PPO | Admitting: Allergy & Immunology

## 2023-11-23 ENCOUNTER — Encounter: Payer: Self-pay | Admitting: Allergy & Immunology

## 2023-11-23 VITALS — BP 160/100 | HR 81 | Temp 97.6°F | Resp 18 | Ht 65.75 in | Wt 156.2 lb

## 2023-11-23 DIAGNOSIS — J310 Chronic rhinitis: Secondary | ICD-10-CM

## 2023-11-23 DIAGNOSIS — F419 Anxiety disorder, unspecified: Secondary | ICD-10-CM | POA: Insufficient documentation

## 2023-11-23 NOTE — Patient Instructions (Addendum)
1. Chronic rhinitis - Because of insurance stipulations, we cannot do skin testing on the same day as your first visit. - We are all working to fight this, but for now we need to do two separate visits.  - We will know more after we do testing at the next visit.  - The skin testing visit can be squeezed in at your convenience.  - Then we can make a more full plan to address all of your symptoms. - Be sure to stop your antihistamines for 3 days before this appointment.  - We many consider allergy shots in the future if indicated. - Information on allergy shots shown below.   2. Return in about 1 week (around 11/30/2023) for ALLERGY TESTING (1-55).Marland Kitchen You can have the follow up appointment with Dr. Dellis Anes or a Nurse Practicioner (our Nurse Practitioners are excellent and always have Physician oversight!).    Please inform us of any Emergency Department visits, hospitalizations, or changes in symptoms. Call us before going to the ED for breathing or allergy symptoms since we might be able to fit you in for a sick visit. Feel free to contact us anytime with any questions, problems, or concerns.  It was a pleasure to meet you today!  Websites that have reliable patient information: 1. American Academy of Asthma, Allergy, and Immunology: www.aaaai.org 2. Food Allergy Research and Education (FARE): foodallergy.org 3. Mothers of Asthmatics: http://www.asthmacommunitynetwork.org 4. American College of Allergy, Asthma, and Immunology: www.acaai.org      "Like" [place] on Facebook and Instagram for our latest updates!      A healthy democracy works best when [employer] participate! Make sure you are registered to vote! If you have moved or changed any of your contact information, you will need to get this updated before voting! Scan the QR codes below to learn more!        Allergy Shots  Allergies are the result of a chain reaction that starts in the immune system. Your immune system controls  how your body defends itself. For instance, if you have an allergy to pollen, your immune system identifies pollen as an invader or allergen. Your immune system overreacts by producing antibodies called Immunoglobulin E (IgE). These antibodies travel to cells that release chemicals, causing an allergic reaction.  The concept behind allergy immunotherapy, whether it is received in the form of shots or tablets, is that the immune system can be desensitized to specific allergens that trigger allergy symptoms. Although it requires time and patience, the payback can be long-term relief. Allergy injections contain a dilute solution of those substances that you are allergic to based upon your skin testing and allergy history.   How Do Allergy Shots Work?  Allergy shots work much like a vaccine. Your body responds to injected amounts of a particular allergen given in increasing doses, eventually developing a resistance and tolerance to it. Allergy shots can lead to decreased, minimal or no allergy symptoms.  There generally are two phases: build-up and maintenance. Build-up often ranges from three to six months and involves receiving injections with increasing amounts of the allergens. The shots are typically given once or twice a week, though more rapid build-up schedules are sometimes used.  The maintenance phase begins when the most effective dose is reached. This dose is different for each person, depending on how allergic you are and your response to the build-up injections. Once the maintenance dose is reached, there are longer periods between injections, typically two to four weeks.  Occasionally doctors give cortisone-type shots that can temporarily reduce allergy symptoms. These types of shots are different and should not be confused with allergy immunotherapy shots.  Who Can Be Treated with Allergy Shots?  Allergy shots may be a good treatment approach for people with allergic rhinitis (hay fever),  allergic asthma, conjunctivitis (eye allergy) or stinging insect allergy.   Before deciding to begin allergy shots, you should consider:   The length of allergy season and the severity of your symptoms  Whether medications and/or changes to your environment can control your symptoms  Your desire to avoid long-term medication use  Time: allergy immunotherapy requires a major time commitment  Cost: may vary depending on your insurance coverage  Allergy shots for children age 79 and older are effective and often well tolerated. They might prevent the onset of new allergen sensitivities or the progression to asthma.  Allergy shots are not started on patients who are pregnant but can be continued on patients who become pregnant while receiving them. In some patients with other medical conditions or who take certain common medications, allergy shots may be of risk. It is important to mention other medications you talk to your allergist.   What are the two types of build-ups offered:   RUSH or Rapid Desensitization -- one day of injections lasting from 8:30-4:30pm, injections every 1 hour.  Approximately half of the build-up process is completed in that one day.  The following week, normal build-up is resumed, and this entails ~16 visits either weekly or twice weekly, until reaching your "maintenance dose" which is continued weekly until eventually getting spaced out to every month for a duration of 3 to 5 years. The regular build-up appointments are nurse visits where the injections are administered, followed by required monitoring for 30 minutes.    Traditional build-up -- weekly visits for 6 -12 months until reaching "maintenance dose", then continue weekly until eventually spacing out to every 4 weeks as above. At these appointments, the injections are administered, followed by required monitoring for 30 minutes.     Either way is acceptable, and both are equally effective. With the rush  protocol, the advantage is that less time is spent here for injections overall AND you would also reach maintenance dosing faster (which is when the clinical benefit starts to become more apparent). Not everyone is a candidate for rapid desensitization.   IF we proceed with the RUSH protocol, there are premedications which must be taken the day before and the day after the rush only (this includes antihistamines, steroids, and Singulair).  After the rush day, no prednisone or Singulair is required, and we just recommend antihistamines taken on your injection day.  What Is An Estimate of the Costs?  If you are interested in starting allergy injections, please check with your insurance company about your coverage for both allergy vial sets and allergy injections.  Please do so prior to making the appointment to start injections.  The following are CPT codes to give to your insurance company. These are the amounts we BILL to the insurance company, but the amount YOU WILL PAY and WE RECEIVE IS SUBSTANTIALLY LESS and depends on the contracts we have with different insurance companies.   Amount Billed to Insurance One allergy vial set  CPT 95165   $ 1200     Two allergy vial set  CPT 95165   $ 2400     Three allergy vial set  CPT 95165   $ 3600  One injection   CPT 95115   $ 35  Two injections   CPT 95117   $ 40 RUSH (Rapid Desensitization) CPT 95180 x 8 hours $500/hour  Regarding the allergy injections, your co-pay may or may not apply with each injection, so please confirm this with your insurance company. When you start allergy injections, 1 or 2 sets of vials are made based on your allergies.  Not all patients can be on one set of vials. A set of vials lasts 6 months to a year depending on how quickly you can proceed with your build-up of your allergy injections. Vials are personalized for each patient depending on their specific allergens.  How often are allergy injection given during the  build-up period?   Injections are given at least weekly during the build-up period until your maintenance dose is achieved. Per the doctor's discretion, you may have the option of getting allergy injections two times per week during the build-up period. However, there must be at least 48 hours between injections. The build-up period is usually completed within 6-12 months depending on your ability to schedule injections and for adjustments for reactions. When maintenance dose is reached, your injection schedule is gradually changed to every two weeks and later to every three weeks. Injections will then continue every 4 weeks. Usually, injections are continued for a total of 3-5 years.   When Will I Feel Better?  Some may experience decreased allergy symptoms during the build-up phase. For others, it may take as long as 12 months on the maintenance dose. If there is no improvement after a year of maintenance, your allergist will discuss other treatment options with you.  If you aren't responding to allergy shots, it may be because there is not enough dose of the allergen in your vaccine or there are missing allergens that were not identified during your allergy testing. Other reasons could be that there are high levels of the allergen in your environment or major exposure to non-allergic triggers like tobacco smoke.  What Is the Length of Treatment?  Once the maintenance dose is reached, allergy shots are generally continued for three to five years. The decision to stop should be discussed with your allergist at that time. Some people may experience a permanent reduction of allergy symptoms. Others may relapse and a longer course of allergy shots can be considered.  What Are the Possible Reactions?  The two types of adverse reactions that can occur with allergy shots are local and systemic. Common local reactions include very mild redness and swelling at the injection site, which can happen  immediately or several hours after. Report a delayed reaction from your last injection. These include arm swelling or runny nose, watery eyes or cough that occurs within 12-24 hours after injection. A systemic reaction, which is less common, affects the entire body or a particular body system. They are usually mild and typically respond quickly to medications. Signs include increased allergy symptoms such as sneezing, a stuffy nose or hives.   Rarely, a serious systemic reaction called anaphylaxis can develop. Symptoms include swelling in the throat, wheezing, a feeling of tightness in the chest, nausea or dizziness. Most serious systemic reactions develop within 30 minutes of allergy shots. This is why it is strongly recommended you wait in your doctor's office for 30 minutes after your injections. Your allergist is trained to watch for reactions, and his or her staff is trained and equipped with the proper medications to identify and treat  them.   Report to the nurse immediately if you experience any of the following symptoms: swelling, itching or redness of the skin, hives, watery eyes/nose, breathing difficulty, excessive sneezing, coughing, stomach pain, diarrhea, or light headedness. These symptoms may occur within 15-20 minutes after injection and may require medication.   Who Should Administer Allergy Shots?  The preferred location for receiving shots is your prescribing allergist's office. Injections can sometimes be given at another facility where the physician and staff are trained to recognize and treat reactions, and have received instructions by your prescribing allergist.  What if I am late for an injection?   Injection dose will be adjusted depending upon how many days or weeks you are late for your injection.   What if I am sick?   Please report any illness to the nurse before receiving injections. She may adjust your dose or postpone injections depending on your symptoms. If you  have fever, flu, sinus infection or chest congestion it is best to postpone allergy injections until you are better. Never get an allergy injection if your asthma is causing you problems. If your symptoms persist, seek out medical care to get your health problem under control.  What If I am or Become Pregnant:  Women that become pregnant should schedule an appointment with [facility] before receiving any further allergy injections.

## 2023-11-23 NOTE — Progress Notes (Unsigned)
NEW PATIENT  Date of Service/Encounter:  11/23/23  Consult requested by: Patient, No Pcp Per   Assessment:   Chronic rhinitis  Plan/Recommendations:   Assessment and Plan              Patient Instructions  1. Chronic rhinitis - Because of insurance stipulations, we cannot do skin testing on the same day as your first visit. - We are all working to fight this, but for now we need to do two separate visits.  - We will know more after we do testing at the next visit.  - The skin testing visit can be squeezed in at your convenience.  - Then we can make a more full plan to address all of your symptoms. - Be sure to stop your antihistamines for 3 days before this appointment.  - We many consider allergy shots in the future if indicated. - Information on allergy shots shown below.   2. Return in about 1 week (around 11/30/2023) for ALLERGY TESTING (1-55).Marland Kitchen You can have the follow up appointment with Dr. Dellis Anes or a Nurse Practicioner (our Nurse Practitioners are excellent and always have Physician oversight!).    Please inform us of any Emergency Department visits, hospitalizations, or changes in symptoms. Call us before going to the ED for breathing or allergy symptoms since we might be able to fit you in for a sick visit. Feel free to contact us anytime with any questions, problems, or concerns.  It was a pleasure to meet you today!  Websites that have reliable patient information: 1. American Academy of Asthma, Allergy, and Immunology: www.aaaai.org 2. Food Allergy Research and Education (FARE): foodallergy.org 3. Mothers of Asthmatics: http://www.asthmacommunitynetwork.org 4. American College of Allergy, Asthma, and Immunology: www.acaai.org      "Like" [place] on Facebook and Instagram for our latest updates!      A healthy democracy works best when [employer] participate! Make sure you are registered to vote! If you have moved or changed any of your contact  information, you will need to get this updated before voting! Scan the QR codes below to learn more!        Allergy Shots  Allergies are the result of a chain reaction that starts in the immune system. Your immune system controls how your body defends itself. For instance, if you have an allergy to pollen, your immune system identifies pollen as an invader or allergen. Your immune system overreacts by producing antibodies called Immunoglobulin E (IgE). These antibodies travel to cells that release chemicals, causing an allergic reaction.  The concept behind allergy immunotherapy, whether it is received in the form of shots or tablets, is that the immune system can be desensitized to specific allergens that trigger allergy symptoms. Although it requires time and patience, the payback can be long-term relief. Allergy injections contain a dilute solution of those substances that you are allergic to based upon your skin testing and allergy history.   How Do Allergy Shots Work?  Allergy shots work much like a vaccine. Your body responds to injected amounts of a particular allergen given in increasing doses, eventually developing a resistance and tolerance to it. Allergy shots can lead to decreased, minimal or no allergy symptoms.  There generally are two phases: build-up and maintenance. Build-up often ranges from three to six months and involves receiving injections with increasing amounts of the allergens. The shots are typically given once or twice a week, though more rapid build-up schedules are sometimes used.  The  maintenance phase begins when the most effective dose is reached. This dose is different for each person, depending on how allergic you are and your response to the build-up injections. Once the maintenance dose is reached, there are longer periods between injections, typically two to four weeks.  Occasionally doctors give cortisone-type shots that can temporarily reduce allergy  symptoms. These types of shots are different and should not be confused with allergy immunotherapy shots.  Who Can Be Treated with Allergy Shots?  Allergy shots may be a good treatment approach for people with allergic rhinitis (hay fever), allergic asthma, conjunctivitis (eye allergy) or stinging insect allergy.   Before deciding to begin allergy shots, you should consider:  . The length of allergy season and the severity of your symptoms . Whether medications and/or changes to your environment can control your symptoms . Your desire to avoid long-term medication use . Time: allergy immunotherapy requires a major time commitment . Cost: may vary depending on your insurance coverage  Allergy shots for children age 41 and older are effective and often well tolerated. They might prevent the onset of new allergen sensitivities or the progression to asthma.  Allergy shots are not started on patients who are pregnant but can be continued on patients who become pregnant while receiving them. In some patients with other medical conditions or who take certain common medications, allergy shots may be of risk. It is important to mention other medications you talk to your allergist.   What are the two types of build-ups offered:   RUSH or Rapid Desensitization -- one day of injections lasting from 8:30-4:30pm, injections every 1 hour.  Approximately half of the build-up process is completed in that one day.  The following week, normal build-up is resumed, and this entails ~16 visits either weekly or twice weekly, until reaching your "maintenance dose" which is continued weekly until eventually getting spaced out to every month for a duration of 3 to 5 years. The regular build-up appointments are nurse visits where the injections are administered, followed by required monitoring for 30 minutes.    Traditional build-up -- weekly visits for 6 -12 months until reaching "maintenance dose", then continue  weekly until eventually spacing out to every 4 weeks as above. At these appointments, the injections are administered, followed by required monitoring for 30 minutes.     Either way is acceptable, and both are equally effective. With the rush protocol, the advantage is that less time is spent here for injections overall AND you would also reach maintenance dosing faster (which is when the clinical benefit starts to become more apparent). Not everyone is a candidate for rapid desensitization.   IF we proceed with the RUSH protocol, there are premedications which must be taken the day before and the day after the rush only (this includes antihistamines, steroids, and Singulair).  After the rush day, no prednisone or Singulair is required, and we just recommend antihistamines taken on your injection day.  What Is An Estimate of the Costs?  If you are interested in starting allergy injections, please check with your insurance company about your coverage for both allergy vial sets and allergy injections.  Please do so prior to making the appointment to start injections.  The following are CPT codes to give to your insurance company. These are the amounts we BILL to the insurance company, but the amount YOU WILL PAY and WE RECEIVE IS SUBSTANTIALLY LESS and depends on the contracts we have with different insurance companies.  Amount Billed to Insurance One allergy vial set  CPT 95165   $ 1200     Two allergy vial set  CPT 95165   $ 2400     Three allergy vial set  CPT 95165   $ 3600     One injection   CPT 95115   $ 35  Two injections   CPT 95117   $ 40 RUSH (Rapid Desensitization) CPT 95180 x 8 hours $500/hour  Regarding the allergy injections, your co-pay may or may not apply with each injection, so please confirm this with your insurance company. When you start allergy injections, 1 or 2 sets of vials are made based on your allergies.  Not all patients can be on one set of vials. A set of vials lasts  6 months to a year depending on how quickly you can proceed with your build-up of your allergy injections. Vials are personalized for each patient depending on their specific allergens.  How often are allergy injection given during the build-up period?   Injections are given at least weekly during the build-up period until your maintenance dose is achieved. Per the doctor's discretion, you may have the option of getting allergy injections two times per week during the build-up period. However, there must be at least 48 hours between injections. The build-up period is usually completed within 6-12 months depending on your ability to schedule injections and for adjustments for reactions. When maintenance dose is reached, your injection schedule is gradually changed to every two weeks and later to every three weeks. Injections will then continue every 4 weeks. Usually, injections are continued for a total of 3-5 years.   When Will I Feel Better?  Some may experience decreased allergy symptoms during the build-up phase. For others, it may take as long as 12 months on the maintenance dose. If there is no improvement after a year of maintenance, your allergist will discuss other treatment options with you.  If you aren't responding to allergy shots, it may be because there is not enough dose of the allergen in your vaccine or there are missing allergens that were not identified during your allergy testing. Other reasons could be that there are high levels of the allergen in your environment or major exposure to non-allergic triggers like tobacco smoke.  What Is the Length of Treatment?  Once the maintenance dose is reached, allergy shots are generally continued for three to five years. The decision to stop should be discussed with your allergist at that time. Some people may experience a permanent reduction of allergy symptoms. Others may relapse and a longer course of allergy shots can be  considered.  What Are the Possible Reactions?  The two types of adverse reactions that can occur with allergy shots are local and systemic. Common local reactions include very mild redness and swelling at the injection site, which can happen immediately or several hours after. Report a delayed reaction from your last injection. These include arm swelling or runny nose, watery eyes or cough that occurs within 12-24 hours after injection. A systemic reaction, which is less common, affects the entire body or a particular body system. They are usually mild and typically respond quickly to medications. Signs include increased allergy symptoms such as sneezing, a stuffy nose or hives.   Rarely, a serious systemic reaction called anaphylaxis can develop. Symptoms include swelling in the throat, wheezing, a feeling of tightness in the chest, nausea or dizziness. Most serious systemic reactions develop within  30 minutes of allergy shots. This is why it is strongly recommended you wait in your doctor's office for 30 minutes after your injections. Your allergist is trained to watch for reactions, and his or her staff is trained and equipped with the proper medications to identify and treat them.   Report to the nurse immediately if you experience any of the following symptoms: swelling, itching or redness of the skin, hives, watery eyes/nose, breathing difficulty, excessive sneezing, coughing, stomach pain, diarrhea, or light headedness. These symptoms may occur within 15-20 minutes after injection and may require medication.   Who Should Administer Allergy Shots?  The preferred location for receiving shots is your prescribing allergist's office. Injections can sometimes be given at another facility where the physician and staff are trained to recognize and treat reactions, and have received instructions by your prescribing allergist.  What if I am late for an injection?   Injection dose will be adjusted  depending upon how many days or weeks you are late for your injection.   What if I am sick?   Please report any illness to the nurse before receiving injections. She may adjust your dose or postpone injections depending on your symptoms. If you have fever, flu, sinus infection or chest congestion it is best to postpone allergy injections until you are better. Never get an allergy injection if your asthma is causing you problems. If your symptoms persist, seek out medical care to get your health problem under control.  What If I am or Become Pregnant:  Women that become pregnant should schedule an appointment with [facility] before receiving any further allergy injections.       {Blank single:19197::"This note in its entirety was forwarded to the Provider who requested this consultation."}  Subjective:   Caitlin Horn is a 58 y.o. female presenting today for evaluation of  Chief Complaint  Patient presents with  . Allergic Rhinitis     Has horses - over a year cleaning and replacing their stalls with hay cause nose bleeds and sores     Caitlin Horn has a history of the following: Patient Active Problem List   Diagnosis Date Noted  . Anxiety 11/23/2023  . Hand lesion 04/07/2014  . Elevated blood pressure reading 11/19/2012  . Insomnia 11/19/2012  . RAD (reactive airway disease) 09/17/2012  . Allergic rhinitis due to animal hair and dander 01/09/2012  . 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 CONTACT DERMATITIS&OTHER ECZEMA DUE TO PLANTS 03/10/2008    History obtained from: chart review and {Persons; PED relatives w/patient:19415::"patient"}.  Discussed the use of AI scribe software for clinical note transcription with the patient and/or guardian, who gave verbal consent to proceed.  Caitlin Horn was referred by Patient, No Pcp Per.     Caitlin Horn is a 58 y.o. female presenting for {Blank single:19197::"a food challenge","a drug challenge","skin testing","a sick  visit","an evaluation of ***","a follow up visit"}.  Discussed the use of AI scribe software for clinical note transcription with the patient, who gave verbal consent to proceed.  History of Present Illness             {Blank single:19197::"Asthma/Respiratory Symptom History: ***"," "}  {Blank single:19197::"Allergic Rhinitis Symptom History: ***"," "}  {Blank single:19197::"Food Allergy Symptom History: ***"," "}  {Blank single:19197::"Skin Symptom History: ***"," "}  {Blank single:19197::"GERD Symptom History: ***"," "}  ***Otherwise, there is no history of other atopic diseases, including {Blank multiple:19196:o:"asthma","food allergies","drug allergies","environmental allergies","stinging insect allergies","eczema","urticaria","contact dermatitis"}. There is  no significant infectious history. ***Vaccinations are up to date.    Past Medical History: Patient Active Problem List   Diagnosis Date Noted  . Anxiety 11/23/2023  . Hand lesion 04/07/2014  . Elevated blood pressure reading 11/19/2012  . Insomnia 11/19/2012  . RAD (reactive airway disease) 09/17/2012  . Allergic rhinitis due to animal hair and dander 01/09/2012  . 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         Medication List        Accurate as of November 23, 2023  4:31 PM. If you have any questions, ask your nurse or doctor.          ALPRAZolam 0.25 MG tablet Commonly known as: XANAX Take 1 tablet (0.25 mg total) by mouth 3 (three) times daily as needed for anxiety.   estradiol 1 MG tablet Commonly known as: ESTRACE Take 1 mg by mouth daily.   meloxicam 7.5 MG tablet Commonly known as: MOBIC Take 1 tablet by mouth daily.   multivitamin tablet Take  1 tablet by mouth daily.        Birth History: {Blank single:19197::"non-contributory","born premature and spent time in the NICU","born at term without complications"}  Developmental History: Caitlin Horn has met all milestones on time. She has required no {Blank multiple:19196:a:"speech therapy","occupational therapy","physical therapy"}. ***non-contributory  Past Surgical History: Past Surgical History:  Procedure Laterality Date  . ABDOMINAL HYSTERECTOMY  11/06/2001   and bladder suspension--Dr. Miguel Aschoff.  Ovaries remain.  Marland Kitchen BLADDER SUSPENSION  11/06/2001   Dr. Edward Jolly  . TONSILLECTOMY       Family History: Family History  Problem Relation Age of Onset  . Pneumonia Mother        dec age 85 from pneumonia  . Cancer Father        dec Lung ca age 56  . Allergic rhinitis Sister      Social History: Halle lives at home with her family.  She lives in a house that is 59 years old.  There is wooded area rugs throughout the home.  They have electric heating and central cooling.  There are 2 dogs and 1 cat in the home.  There are dust mite covers on the bed, but not the pillows.  There is no tobacco exposure.  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  They have a HEPA filter.  There is no fume, chemical, or dust exposure. She spends a lot of her time caring for her brother, who has metastatic thyroid cancer. He was placed on hospice in 2023 and is still kicking.    Review of systems otherwise negative other than that mentioned in the HPI.    Objective:   Blood pressure (!) 160/100, pulse 81, temperature 97.6 F (36.4 C), resp. rate 18, height 5' 5.75" (1.67 m), weight 156 lb 3.2 oz (70.9 kg), SpO2 97%. Body mass index is 25.4 kg/m.     Physical Exam   Diagnostic studies: {Blank single:19197::"none","deferred due to recent antihistamine use","deferred due to insurance stipulations that require a separate visit for testing","labs sent  instead"," "}  Spirometry: {Blank single:19197::"results normal (FEV1: ***%, FVC: ***%, FEV1/FVC: ***%)","results abnormal (FEV1: ***%, FVC: ***%, FEV1/FVC: ***%)"}.    {  Blank single:19197::"Spirometry consistent with mild obstructive disease","Spirometry consistent with moderate obstructive disease","Spirometry consistent with severe obstructive disease","Spirometry consistent with possible restrictive disease","Spirometry consistent with mixed obstructive and restrictive disease","Spirometry uninterpretable due to technique","Spirometry consistent with normal pattern"}. {Blank single:19197::"Albuterol/Atrovent nebulizer","Xopenex/Atrovent nebulizer","Albuterol nebulizer","Albuterol four puffs via MDI","Xopenex four puffs via MDI"} treatment given in clinic with {Blank single:19197::"significant improvement in FEV1 per ATS criteria","significant improvement in FVC per ATS criteria","significant improvement in FEV1 and FVC per ATS criteria","improvement in FEV1, but not significant per ATS criteria","improvement in FVC, but not significant per ATS criteria","improvement in FEV1 and FVC, but not significant per ATS criteria","no improvement"}.  Allergy Studies: {Blank single:19197::"none","deferred due to recent antihistamine use","deferred due to insurance stipulations that require a separate visit for testing","labs sent instead"," "}    {Blank single:19197::"Allergy testing results were read and interpreted by myself, documented by clinical staff."," "}         Malachi Bonds, MD [facility]

## 2023-12-05 ENCOUNTER — Telehealth: Payer: Self-pay | Admitting: Allergy & Immunology

## 2023-12-05 ENCOUNTER — Ambulatory Visit: Payer: BC Managed Care – PPO | Admitting: Allergy & Immunology

## 2023-12-05 DIAGNOSIS — J3089 Other allergic rhinitis: Secondary | ICD-10-CM

## 2023-12-05 DIAGNOSIS — J302 Other seasonal allergic rhinitis: Secondary | ICD-10-CM | POA: Diagnosis not present

## 2023-12-05 NOTE — Patient Instructions (Addendum)
1. Chronic rhinitis - Testing today showed: grasses, weeds, trees, indoor molds, outdoor molds, dust mites, cat, dog, and mixed feather - Copy of test results provided.  - Avoidance measures provided. - Continue with: Xyzal (levocetirizine) 5mg  tablet once daily - You can use an extra dose of the antihistamine, if needed, for breakthrough symptoms.  - Consider nasal saline rinses 1-2 times daily to remove allergens from the nasal cavities as well as help with mucous clearance (this is especially helpful to do before the nasal sprays are given) - We will start allergy shots as a means of long-term control. - Allergy shots "re-train" and "reset" the immune system to ignore environmental allergens and decrease the resulting immune response to those allergens (sneezing, itchy watery eyes, runny nose, nasal congestion, etc).    - Allergy shots improve symptoms in 75-85% of patients.  - Consent for allergy shots signed.   2. Return in about 3 months (around 03/04/2024). You can have the follow up appointment with Dr. Dellis Anes or a Nurse Practicioner (our Nurse Practitioners are excellent and always have Physician oversight!).    Please inform us of any Emergency Department visits, hospitalizations, or changes in symptoms. Call us before going to the ED for breathing or allergy symptoms since we might be able to fit you in for a sick visit. Feel free to contact us anytime with any questions, problems, or concerns.  It was a pleasure to meet you today!  Websites that have reliable patient information: 1. American Academy of Asthma, Allergy, and Immunology: www.aaaai.org 2. Food Allergy Research and Education (FARE): foodallergy.org 3. Mothers of Asthmatics: http://www.asthmacommunitynetwork.org 4. American College of Allergy, Asthma, and Immunology: www.acaai.org      "Like" [place] on Facebook and Instagram for our latest updates!      A healthy democracy works best when [employer] participate!  Make sure you are registered to vote! If you have moved or changed any of your contact information, you will need to get this updated before voting! Scan the QR codes below to learn more!     Airborne Adult Perc - 12/05/23 1430     Time Antigen Placed 1430    Allergen Manufacturer Waynette Buttery    Location Back    Number of Test 55    1. Control-Buffer 50% Glycerol Negative    2. Control-Histamine 2+    3. Bahia Negative    4. [place] Negative    5. Johnson 2+    6. Kentucky Blue Negative    7. Meadow Fescue Negative    8. Perennial Rye 3+    9. Timothy 2+    10. Ragweed Mix Negative    11. Cocklebur Negative    12. Plantain,  English Negative    13. Baccharis Negative    14. Dog Fennel Negative    15. Russian Thistle Negative    16. Lamb's Quarters Negative    17. Sheep Sorrell Negative    18. Rough Pigweed Negative    19. Marsh Elder, Rough 3+    20. Mugwort, Common 2+    21. Box, Elder Negative    22. Cedar, red Negative    23. Sweet Gum Negative    24. Pecan Pollen Negative    25. Pine Mix Negative    26. Walnut, Black Pollen 3+    27. Red Mulberry 2+    28. Ash Mix 2+    29. Birch Mix 2+    30. Beech American 2+    31. Cottonwood,  Eastern Negative    32. Hickory, White Negative    33. Maple Mix 3+    34. Oak, Guinea-[place] Mix 2+    35. Sycamore Eastern Negative    36. Alternaria Alternata 2+    37. Cladosporium Herbarum 2+    38. Aspergillus Mix 2+    39. Penicillium Mix 2+    40. Bipolaris Sorokiniana (Helminthosporium) 2+    41. Drechslera Spicifera (Curvularia) Negative    42. Mucor Plumbeus Negative    43. Fusarium Moniliforme Negative    44. Aureobasidium Pullulans (pullulara) 3+    45. Rhizopus Oryzae Negative    46. Botrytis Cinera 3+    47. Epicoccum Nigrum Negative    48. Phoma Betae Negative    49. Dust Mite Mix 3+    50. Cat Hair 10,000 BAU/ml 2+    51.  Dog Epithelia 2+    52. Mixed Feathers 2+    53. Horse Epithelia Negative    54. Cockroach, German  Negative    55. Tobacco Leaf Negative             Intradermal - 12/05/23 1551     Time Antigen Placed 1430    Allergen Manufacturer Waynette Buttery    Location Back    Number of Test 5    Control Negative    Bahia Negative    [place] 2+    Ragweed Mix Negative    Cockroach Negative             Reducing Pollen Exposure  The American Academy of Allergy, Asthma and Immunology suggests the following steps to reduce your exposure to pollen during allergy seasons.    Do not hang sheets or clothing out to dry; pollen may collect on these items. Do not mow lawns or spend time around freshly cut grass; mowing stirs up pollen. Keep windows closed at night.  Keep car windows closed while driving. Minimize morning activities outdoors, a time when pollen counts are usually at their highest. Stay indoors as much as possible when pollen counts or humidity is high and on windy days when pollen tends to remain in the air longer. Use air conditioning when possible.  Many air conditioners have filters that trap the pollen spores. Use a HEPA room air filter to remove pollen form the indoor air you breathe.  Control of Mold Allergen   Mold and fungi can grow on a variety of surfaces provided certain temperature and moisture conditions exist.  Outdoor molds grow on plants, decaying vegetation and soil.  The major outdoor mold, Alternaria and Cladosporium, are found in very high numbers during hot and dry conditions.  Generally, a late Summer - Fall peak is seen for common outdoor fungal spores.  Rain will temporarily lower outdoor mold spore count, but counts rise rapidly when the rainy period ends.  The most important indoor molds are Aspergillus and Penicillium.  Dark, humid and poorly ventilated basements are ideal sites for mold growth.  The next most common sites of mold growth are the bathroom and the kitchen.  Outdoor (Seasonal) Mold Control   Use air conditioning and keep windows closed Avoid  exposure to decaying vegetation. Avoid leaf raking. Avoid grain handling. Consider wearing a face mask if working in moldy areas.    Indoor (Perennial) Mold Control    Maintain humidity below 50%. Clean washable surfaces with 5% bleach solution. Remove sources e.g. contaminated carpets.    Control of Dog or Cat Allergen  Avoidance is the best  way to manage a dog or cat allergy. If you have a dog or cat and are allergic to dog or cats, consider removing the dog or cat from the home. If you have a dog or cat but don't want to find it a new home, or if your family wants a pet even though someone in the household is allergic, here are some strategies that may help keep symptoms at bay:  Keep the pet out of your bedroom and restrict it to only a few rooms. Be advised that keeping the dog or cat in only one room will not limit the allergens to that room. Don't pet, hug or kiss the dog or cat; if you do, wash your hands with soap and water. High-efficiency particulate air (HEPA) cleaners run continuously in a bedroom or living room can reduce allergen levels over time. Regular use of a high-efficiency vacuum cleaner or a central vacuum can reduce allergen levels. Giving your dog or cat a bath at least once a week can reduce airborne allergen.  Control of Dust Mite Allergen    Dust mites play a major role in allergic asthma and rhinitis.  They occur in environments with high humidity wherever human skin is found.  Dust mites absorb humidity from the atmosphere (ie, they do not drink) and feed on organic matter (including shed human and animal skin).  Dust mites are a microscopic type of insect that you cannot see with the naked eye.  High levels of dust mites have been detected from mattresses, pillows, carpets, upholstered furniture, bed covers, clothes, soft toys and any woven material.  The principal allergen of the dust mite is found in its feces.  A gram of dust may contain 1,000 mites and  250,000 fecal particles.  Mite antigen is easily measured in the air during house cleaning activities.  Dust mites do not bite and do not cause harm to humans, other than by triggering allergies/asthma.    Ways to decrease your exposure to dust mites in your home:  Encase mattresses, box springs and pillows with a mite-impermeable barrier or cover   Wash sheets, blankets and drapes weekly in hot water (130 F) with detergent and dry them in a dryer on the hot setting.  Have the room cleaned frequently with a vacuum cleaner and a damp dust-mop.  For carpeting or rugs, vacuuming with a vacuum cleaner equipped with a high-efficiency particulate air (HEPA) filter.  The dust mite allergic individual should not be in a room which is being cleaned and should wait 1 hour after cleaning before going into the room. Do not sleep on upholstered furniture (eg, couches).   If possible removing carpeting, upholstered furniture and drapery from the home is ideal.  Horizontal blinds should be eliminated in the rooms where the person spends the most time (bedroom, study, television room).  Washable vinyl, roller-type shades are optimal. Remove all non-washable stuffed toys from the bedroom.  Wash stuffed toys weekly like sheets and blankets above.   Reduce indoor humidity to less than 50%.  Inexpensive humidity monitors can be purchased at most hardware stores.  Do not use a humidifier as can make the problem worse and are not recommended.        Allergy Shots  Allergies are the result of a chain reaction that starts in the immune system. Your immune system controls how your body defends itself. For instance, if you have an allergy to pollen, your immune system identifies pollen as an invader  or allergen. Your immune system overreacts by producing antibodies called Immunoglobulin E (IgE). These antibodies travel to cells that release chemicals, causing an allergic reaction.  The concept behind allergy  immunotherapy, whether it is received in the form of shots or tablets, is that the immune system can be desensitized to specific allergens that trigger allergy symptoms. Although it requires time and patience, the payback can be long-term relief. Allergy injections contain a dilute solution of those substances that you are allergic to based upon your skin testing and allergy history.   How Do Allergy Shots Work?  Allergy shots work much like a vaccine. Your body responds to injected amounts of a particular allergen given in increasing doses, eventually developing a resistance and tolerance to it. Allergy shots can lead to decreased, minimal or no allergy symptoms.  There generally are two phases: build-up and maintenance. Build-up often ranges from three to six months and involves receiving injections with increasing amounts of the allergens. The shots are typically given once or twice a week, though more rapid build-up schedules are sometimes used.  The maintenance phase begins when the most effective dose is reached. This dose is different for each person, depending on how allergic you are and your response to the build-up injections. Once the maintenance dose is reached, there are longer periods between injections, typically two to four weeks.  Occasionally doctors give cortisone-type shots that can temporarily reduce allergy symptoms. These types of shots are different and should not be confused with allergy immunotherapy shots.  Who Can Be Treated with Allergy Shots?  Allergy shots may be a good treatment approach for people with allergic rhinitis (hay fever), allergic asthma, conjunctivitis (eye allergy) or stinging insect allergy.   Before deciding to begin allergy shots, you should consider:   The length of allergy season and the severity of your symptoms  Whether medications and/or changes to your environment can control your symptoms  Your desire to avoid long-term medication use   Time: allergy immunotherapy requires a major time commitment  Cost: may vary depending on your insurance coverage  Allergy shots for children age 11 and older are effective and often well tolerated. They might prevent the onset of new allergen sensitivities or the progression to asthma.  Allergy shots are not started on patients who are pregnant but can be continued on patients who become pregnant while receiving them. In some patients with other medical conditions or who take certain common medications, allergy shots may be of risk. It is important to mention other medications you talk to your allergist.   What are the two types of build-ups offered:   RUSH or Rapid Desensitization -- one day of injections lasting from 8:30-4:30pm, injections every 1 hour.  Approximately half of the build-up process is completed in that one day.  The following week, normal build-up is resumed, and this entails ~16 visits either weekly or twice weekly, until reaching your "maintenance dose" which is continued weekly until eventually getting spaced out to every month for a duration of 3 to 5 years. The regular build-up appointments are nurse visits where the injections are administered, followed by required monitoring for 30 minutes.    Traditional build-up -- weekly visits for 6 -12 months until reaching "maintenance dose", then continue weekly until eventually spacing out to every 4 weeks as above. At these appointments, the injections are administered, followed by required monitoring for 30 minutes.     Either way is acceptable, and both are equally effective. With  the rush protocol, the advantage is that less time is spent here for injections overall AND you would also reach maintenance dosing faster (which is when the clinical benefit starts to become more apparent). Not everyone is a candidate for rapid desensitization.   IF we proceed with the RUSH protocol, there are premedications which must be taken the  day before and the day after the rush only (this includes antihistamines, steroids, and Singulair).  After the rush day, no prednisone or Singulair is required, and we just recommend antihistamines taken on your injection day.  What Is An Estimate of the Costs?  If you are interested in starting allergy injections, please check with your insurance company about your coverage for both allergy vial sets and allergy injections.  Please do so prior to making the appointment to start injections.  The following are CPT codes to give to your insurance company. These are the amounts we BILL to the insurance company, but the amount YOU WILL PAY and WE RECEIVE IS SUBSTANTIALLY LESS and depends on the contracts we have with different insurance companies.   Amount Billed to Insurance One allergy vial set  CPT 95165   $ 1200     Two allergy vial set  CPT 95165   $ 2400     Three allergy vial set  CPT 95165   $ 3600     One injection   CPT 95115   $ 35  Two injections   CPT 95117   $ 40 RUSH (Rapid Desensitization) CPT 95180 x 8 hours $500/hour  Regarding the allergy injections, your co-pay may or may not apply with each injection, so please confirm this with your insurance company. When you start allergy injections, 1 or 2 sets of vials are made based on your allergies.  Not all patients can be on one set of vials. A set of vials lasts 6 months to a year depending on how quickly you can proceed with your build-up of your allergy injections. Vials are personalized for each patient depending on their specific allergens.  How often are allergy injection given during the build-up period?   Injections are given at least weekly during the build-up period until your maintenance dose is achieved. Per the doctor's discretion, you may have the option of getting allergy injections two times per week during the build-up period. However, there must be at least 48 hours between injections. The build-up period is usually  completed within 6-12 months depending on your ability to schedule injections and for adjustments for reactions. When maintenance dose is reached, your injection schedule is gradually changed to every two weeks and later to every three weeks. Injections will then continue every 4 weeks. Usually, injections are continued for a total of 3-5 years.   When Will I Feel Better?  Some may experience decreased allergy symptoms during the build-up phase. For others, it may take as long as 12 months on the maintenance dose. If there is no improvement after a year of maintenance, your allergist will discuss other treatment options with you.  If you aren't responding to allergy shots, it may be because there is not enough dose of the allergen in your vaccine or there are missing allergens that were not identified during your allergy testing. Other reasons could be that there are high levels of the allergen in your environment or major exposure to non-allergic triggers like tobacco smoke.  What Is the Length of Treatment?  Once the maintenance dose is reached, allergy  shots are generally continued for three to five years. The decision to stop should be discussed with your allergist at that time. Some people may experience a permanent reduction of allergy symptoms. Others may relapse and a longer course of allergy shots can be considered.  What Are the Possible Reactions?  The two types of adverse reactions that can occur with allergy shots are local and systemic. Common local reactions include very mild redness and swelling at the injection site, which can happen immediately or several hours after. Report a delayed reaction from your last injection. These include arm swelling or runny nose, watery eyes or cough that occurs within 12-24 hours after injection. A systemic reaction, which is less common, affects the entire body or a particular body system. They are usually mild and typically respond quickly to  medications. Signs include increased allergy symptoms such as sneezing, a stuffy nose or hives.   Rarely, a serious systemic reaction called anaphylaxis can develop. Symptoms include swelling in the throat, wheezing, a feeling of tightness in the chest, nausea or dizziness. Most serious systemic reactions develop within 30 minutes of allergy shots. This is why it is strongly recommended you wait in your doctor's office for 30 minutes after your injections. Your allergist is trained to watch for reactions, and his or her staff is trained and equipped with the proper medications to identify and treat them.   Report to the nurse immediately if you experience any of the following symptoms: swelling, itching or redness of the skin, hives, watery eyes/nose, breathing difficulty, excessive sneezing, coughing, stomach pain, diarrhea, or light headedness. These symptoms may occur within 15-20 minutes after injection and may require medication.   Who Should Administer Allergy Shots?  The preferred location for receiving shots is your prescribing allergist's office. Injections can sometimes be given at another facility where the physician and staff are trained to recognize and treat reactions, and have received instructions by your prescribing allergist.  What if I am late for an injection?   Injection dose will be adjusted depending upon how many days or weeks you are late for your injection.   What if I am sick?   Please report any illness to the nurse before receiving injections. She may adjust your dose or postpone injections depending on your symptoms. If you have fever, flu, sinus infection or chest congestion it is best to postpone allergy injections until you are better. Never get an allergy injection if your asthma is causing you problems. If your symptoms persist, seek out medical care to get your health problem under control.  What If I am or Become Pregnant:  Women that become pregnant should  schedule an appointment with [facility] before receiving any further allergy injections.

## 2023-12-05 NOTE — Telephone Encounter (Signed)
Patient called stating her insurance does cover allergy injections. Patient's new start appointment has been scheduled for 12/26/23 at 3:30.

## 2023-12-05 NOTE — Progress Notes (Unsigned)
FOLLOW UP  Date of Service/Encounter:  12/05/23   Assessment:   Perennial and seasonal allergic rhinitis (grasses, weeds, trees, indoor molds, outdoor molds, dust mites, cat, dog, and mixed feather)  Plan/Recommendations:   1. Chronic rhinitis - Testing today showed: grasses, weeds, trees, indoor molds, outdoor molds, dust mites, cat, dog, and mixed feather - Copy of test results provided.  - Avoidance measures provided. - Continue with: Xyzal (levocetirizine) 5mg  tablet once daily - You can use an extra dose of the antihistamine, if needed, for breakthrough symptoms.  - Consider nasal saline rinses 1-2 times daily to remove allergens from the nasal cavities as well as help with mucous clearance (this is especially helpful to do before the nasal sprays are given) - We will start allergy shots as a means of long-term control. - Allergy shots "re-train" and "reset" the immune system to ignore environmental allergens and decrease the resulting immune response to those allergens (sneezing, itchy watery eyes, runny nose, nasal congestion, etc).    - Allergy shots improve symptoms in 75-85% of patients.  - Consent for allergy shots signed.   2. Return in about 3 months (around 03/04/2024). You can have the follow up appointment with Dr. Dellis Anes or a Nurse Practicioner (our Nurse Practitioners are excellent and always have Physician oversight!).   Subjective:   JOZY MCPHEARSON is a 58 y.o. female presenting today for follow up of No chief complaint on file.   GEORGIANA SPILLANE has a history of the following: Patient Active Problem List   Diagnosis Date Noted   Anxiety 11/23/2023   Hand lesion 04/07/2014   Elevated blood pressure reading 11/19/2012   Insomnia 11/19/2012   RAD (reactive airway disease) 09/17/2012   Allergic rhinitis due to animal hair and dander 01/09/2012   NEPHROLITHIASIS, HX OF 08/17/2008   CONTACT DERMATITIS&OTHER ECZEMA DUE TO PLANTS 03/10/2008    History  obtained from: chart review and patient.  Discussed the use of AI scribe software for clinical note transcription with the patient and/or guardian, who gave verbal consent to proceed.  Taje is a 58 y.o. female presenting for skin testing. She was last seen on January 17th. We could not do testing because her insurance company does not cover testing on the same day as a New Patient visit. She has been off of all antihistamines 3 days in anticipation of the testing.   Otherwise, there have been no changes to her past medical history, surgical history, family history, or social history.    Review of systems otherwise negative other than that mentioned in the HPI.    Objective:   There were no vitals taken for this visit. There is no height or weight on file to calculate BMI.    Physical exam deferred since this was a skin testing appointment only.   Diagnostic studies:   Allergy Studies:     Airborne Adult Perc - 12/05/23 1430     Time Antigen Placed 1430    Allergen Manufacturer Waynette Buttery    Location Back    Number of Test 55    1. Control-Buffer 50% Glycerol Negative    2. Control-Histamine 2+    3. Bahia Negative    4. [place] Negative    5. Johnson 2+    6. [place] Blue Negative    7. Meadow Fescue Negative    8. Perennial Rye 3+    9. Timothy 2+    10. Ragweed Mix Negative    11. Cocklebur Negative  12. Plantain,  English Negative    13. Baccharis Negative    14. Dog Fennel Negative    15. Russian Thistle Negative    16. Lamb's Quarters Negative    17. Sheep Sorrell Negative    18. Rough Pigweed Negative    19. Marsh Elder, Rough 3+    20. Mugwort, Common 2+    21. Box, Elder Negative    22. Cedar, red Negative    23. Sweet Gum Negative    24. Pecan Pollen Negative    25. Pine Mix Negative    26. Walnut, Black Pollen 3+    27. Red Mulberry 2+    28. Ash Mix 2+    29. Birch Mix 2+    30. Beech American 2+    31. Cottonwood, Guinea-Bissau Negative    32.  Hickory, White Negative    33. Maple Mix 3+    34. Oak, [place] Mix 2+    35. Sycamore Eastern Negative    36. Alternaria Alternata 2+    37. Cladosporium Herbarum 2+    38. Aspergillus Mix 2+    39. Penicillium Mix 2+    40. Bipolaris Sorokiniana (Helminthosporium) 2+    41. Drechslera Spicifera (Curvularia) Negative    42. Mucor Plumbeus Negative    43. Fusarium Moniliforme Negative    44. Aureobasidium Pullulans (pullulara) 3+    45. Rhizopus Oryzae Negative    46. Botrytis Cinera 3+    47. Epicoccum Nigrum Negative    48. Phoma Betae Negative    49. Dust Mite Mix 3+    50. Cat Hair 10,000 BAU/ml 2+    51.  Dog Epithelia 2+    52. Mixed Feathers 2+    53. Horse Epithelia Negative    54. Cockroach, German Negative    55. Tobacco Leaf Negative             Intradermal - 12/05/23 1551     Time Antigen Placed 1430    Allergen Manufacturer Waynette Buttery    Location Back    Number of Test 5    Control Negative    Bahia Negative    [place] 2+    Ragweed Mix Negative    Cockroach Negative             Allergy testing results were read and interpreted by myself, documented by clinical staff.      Malachi Bonds, MD  [facility]

## 2023-12-06 ENCOUNTER — Encounter: Payer: Self-pay | Admitting: Allergy & Immunology

## 2023-12-06 DIAGNOSIS — J3081 Allergic rhinitis due to animal (cat) (dog) hair and dander: Secondary | ICD-10-CM | POA: Diagnosis not present

## 2023-12-06 MED ORDER — LEVOCETIRIZINE DIHYDROCHLORIDE 5 MG PO TABS: 5.0000 mg | ORAL_TABLET | Freq: Every evening | ORAL | 5 refills | Status: AC

## 2023-12-06 MED ORDER — EPINEPHRINE 0.3 MG/0.3ML IJ SOAJ: 0.3000 mg | INTRAMUSCULAR | 1 refills | Status: AC | PRN

## 2023-12-06 NOTE — Progress Notes (Signed)
Aeroallergen Immunotherapy   Ordering Provider: Dr. Malachi Bonds   Patient Details  Name: Caitlin Horn  MRN: [id number]  Date of Birth: May 31, 1966   Order 2 of 2   Vial Label: Molds/DM   0.2 ml (Volume)  1:20 Concentration -- Alternaria alternata  0.2 ml (Volume)  1:20 Concentration -- Cladosporium herbarum  0.2 ml (Volume)  1:10 Concentration -- Aspergillus mix  0.2 ml (Volume)  1:10 Concentration -- Penicillium mix  0.2 ml (Volume)  1:20 Concentration -- Bipolaris sorokiniana  0.2 ml (Volume)  1:40 Concentration -- Aureobasidium pullulans  1.0 ml (Volume)   AU Concentration -- Mite Mix (DF 5,000 & DP 5,000)    2.2  ml Extract Subtotal  2.8  ml Diluent  5.0  ml Maintenance Total   Schedule:  B   Blue Vial (1:100,000): Schedule B (6 doses)  Yellow Vial (1:10,000): Schedule B (6 doses)  Green Vial (1:1,000): Schedule B (6 doses)  Red Vial (1:100): Schedule A (14 doses)   Special Instructions: After completion of the first Red Vial, please space to every two weeks. After completion of the second Red Vial, please space to every 4 weeks. Ok to up dose new vials at 0.55mL --> 0.3 mL --> 0.5 mL. Ok to come twice weekly, if desired, as long as there is 48 hours between injections.

## 2023-12-06 NOTE — Progress Notes (Signed)
Aeroallergen Immunotherapy  Ordering Provider: Dr. Malachi Bonds  Patient Details Name: AXIE HAYNE MRN: [id number] Date of Birth: [birth year]  Order 1 of 2  Vial Label: G/W/T/C/D  0.3 ml (Volume)  BAU Concentration -- 7 Grass Mix* 100,000 ([address] [place], [place], [place], [place] Rye, RedTop, Sweet Vernal, Timothy) 0.3 ml (Volume)  BAU Concentration -- [place] 10,000 0.2 ml (Volume)  1:20 Concentration -- Johnson 0.2 ml (Volume)  1:20 Concentration -- Marsh elder, Rough* 0.2 ml (Volume)  1:20 Concentration -- Mugwort, Common* 0.5 ml (Volume)  1:20 Concentration -- Eastern 10 Tree Mix (also Sweet Gum) 0.1 ml (Volume)  1:20 Concentration -- Ash mix* 0.1 ml (Volume)  1:10 Concentration -- Charletta Cousin mix* 0.1 ml (Volume)  1:20 Concentration -- [place] American* 0.1 ml (Volume)  1:20 Concentration -- Maple Mix* 0.1 ml (Volume)  1:10 Concentration -- Oak, Guinea-Bissau mix* 0.2 ml (Volume)  1:20 Concentration -- Red Mulberry 0.2 ml (Volume)  1:20 Concentration -- Walnut, Black Pollen 1.0 ml (Volume)  1:10 Concentration -- Cat Hair 1.0 ml (Volume)  1:10 Concentration -- Dog Epithelia   4.6  ml Extract Subtotal 0.4  ml Diluent 5.0  ml Maintenance Total  Schedule:  B  Blue Vial (1:100,000): Schedule B (6 doses) Yellow Vial (1:10,000): Schedule B (6 doses) Green Vial (1:1,000): Schedule B (6 doses) Red Vial (1:100): Schedule A (14 doses)  Special Instructions: After completion of the first Red Vial, please space to every two weeks. After completion of the second Red Vial, please space to every 4 weeks. Ok to up dose new vials at 0.11mL --> 0.3 mL --> 0.5 mL. Ok to come twice weekly, if desired, as long as there is 48 hours between injections.

## 2023-12-06 NOTE — Progress Notes (Signed)
VIALS EXP 12-05-24

## 2023-12-07 DIAGNOSIS — J3089 Other allergic rhinitis: Secondary | ICD-10-CM | POA: Diagnosis not present

## 2023-12-07 NOTE — Telephone Encounter (Signed)
 Script written

## 2023-12-26 ENCOUNTER — Ambulatory Visit: Payer: BC Managed Care – PPO

## 2023-12-31 ENCOUNTER — Ambulatory Visit: Payer: BC Managed Care – PPO

## 2024-01-02 ENCOUNTER — Ambulatory Visit (INDEPENDENT_AMBULATORY_CARE_PROVIDER_SITE_OTHER): Payer: BC Managed Care – PPO

## 2024-01-02 DIAGNOSIS — J309 Allergic rhinitis, unspecified: Secondary | ICD-10-CM | POA: Diagnosis not present

## 2024-01-02 NOTE — Progress Notes (Signed)
 Immunotherapy   Patient Details  Name: Caitlin Horn MRN: [id number] Date of Birth: [birth year]  01/02/2024  Rosanna Randy started injections for  molds, dust mites, grasses, weeds, trees, cats, and dogs.  Following schedule: B  Frequency:2 times per week Epi-Pen:Epi-Pen Available  Consent signed previously and patient instructions given. Patient sat in her car for thirty minutes as she had to finish a work meeting. I did go out and check up on her frequently. Patient waited in her car for thirty minutes without an issue.    Ralene Muskrat 01/02/2024, 2:28 PM

## 2024-01-09 ENCOUNTER — Ambulatory Visit (INDEPENDENT_AMBULATORY_CARE_PROVIDER_SITE_OTHER): Payer: Self-pay

## 2024-01-09 DIAGNOSIS — J309 Allergic rhinitis, unspecified: Secondary | ICD-10-CM | POA: Diagnosis not present

## 2024-01-11 ENCOUNTER — Ambulatory Visit (INDEPENDENT_AMBULATORY_CARE_PROVIDER_SITE_OTHER): Payer: Self-pay

## 2024-01-11 DIAGNOSIS — J309 Allergic rhinitis, unspecified: Secondary | ICD-10-CM | POA: Diagnosis not present

## 2024-01-16 ENCOUNTER — Ambulatory Visit (INDEPENDENT_AMBULATORY_CARE_PROVIDER_SITE_OTHER): Payer: Self-pay

## 2024-01-16 DIAGNOSIS — J309 Allergic rhinitis, unspecified: Secondary | ICD-10-CM | POA: Diagnosis not present

## 2024-01-18 ENCOUNTER — Ambulatory Visit (INDEPENDENT_AMBULATORY_CARE_PROVIDER_SITE_OTHER): Payer: Self-pay

## 2024-01-18 DIAGNOSIS — J309 Allergic rhinitis, unspecified: Secondary | ICD-10-CM

## 2024-01-23 ENCOUNTER — Ambulatory Visit (INDEPENDENT_AMBULATORY_CARE_PROVIDER_SITE_OTHER): Payer: Self-pay

## 2024-01-23 DIAGNOSIS — J309 Allergic rhinitis, unspecified: Secondary | ICD-10-CM

## 2024-01-25 ENCOUNTER — Ambulatory Visit (INDEPENDENT_AMBULATORY_CARE_PROVIDER_SITE_OTHER): Payer: Self-pay

## 2024-01-25 DIAGNOSIS — J309 Allergic rhinitis, unspecified: Secondary | ICD-10-CM

## 2024-01-30 ENCOUNTER — Ambulatory Visit (INDEPENDENT_AMBULATORY_CARE_PROVIDER_SITE_OTHER): Payer: Self-pay

## 2024-01-30 DIAGNOSIS — J309 Allergic rhinitis, unspecified: Secondary | ICD-10-CM | POA: Diagnosis not present

## 2024-02-01 ENCOUNTER — Ambulatory Visit (INDEPENDENT_AMBULATORY_CARE_PROVIDER_SITE_OTHER): Payer: Self-pay

## 2024-02-01 DIAGNOSIS — J309 Allergic rhinitis, unspecified: Secondary | ICD-10-CM

## 2024-02-06 ENCOUNTER — Ambulatory Visit (INDEPENDENT_AMBULATORY_CARE_PROVIDER_SITE_OTHER): Payer: Self-pay

## 2024-02-06 DIAGNOSIS — J309 Allergic rhinitis, unspecified: Secondary | ICD-10-CM

## 2024-02-08 ENCOUNTER — Ambulatory Visit (INDEPENDENT_AMBULATORY_CARE_PROVIDER_SITE_OTHER)

## 2024-02-08 DIAGNOSIS — J309 Allergic rhinitis, unspecified: Secondary | ICD-10-CM | POA: Diagnosis not present

## 2024-02-13 ENCOUNTER — Ambulatory Visit (INDEPENDENT_AMBULATORY_CARE_PROVIDER_SITE_OTHER): Payer: Self-pay

## 2024-02-13 DIAGNOSIS — J309 Allergic rhinitis, unspecified: Secondary | ICD-10-CM

## 2024-02-15 ENCOUNTER — Ambulatory Visit (INDEPENDENT_AMBULATORY_CARE_PROVIDER_SITE_OTHER)

## 2024-02-15 DIAGNOSIS — J309 Allergic rhinitis, unspecified: Secondary | ICD-10-CM

## 2024-02-18 ENCOUNTER — Ambulatory Visit (INDEPENDENT_AMBULATORY_CARE_PROVIDER_SITE_OTHER): Payer: Self-pay

## 2024-02-18 DIAGNOSIS — J309 Allergic rhinitis, unspecified: Secondary | ICD-10-CM

## 2024-02-20 ENCOUNTER — Ambulatory Visit (INDEPENDENT_AMBULATORY_CARE_PROVIDER_SITE_OTHER): Payer: Self-pay

## 2024-02-20 DIAGNOSIS — J309 Allergic rhinitis, unspecified: Secondary | ICD-10-CM

## 2024-02-27 ENCOUNTER — Ambulatory Visit (INDEPENDENT_AMBULATORY_CARE_PROVIDER_SITE_OTHER): Payer: Self-pay

## 2024-02-27 DIAGNOSIS — J309 Allergic rhinitis, unspecified: Secondary | ICD-10-CM | POA: Diagnosis not present

## 2024-02-29 ENCOUNTER — Ambulatory Visit (INDEPENDENT_AMBULATORY_CARE_PROVIDER_SITE_OTHER): Payer: Self-pay

## 2024-02-29 DIAGNOSIS — J309 Allergic rhinitis, unspecified: Secondary | ICD-10-CM

## 2024-03-04 NOTE — Progress Notes (Signed)
   [address] [place] [address] Dept: [phone]  FOLLOW UP NOTE  Patient ID: Caitlin Horn, female    DOB: 26-Oct-1966  Age: 58 y.o. MRN: [id number] Date of Office Visit: 03/05/2024  Assessment  Chief Complaint: No chief complaint on file.  HPI Caitlin Horn is a 58 year old female who presents to the clinic for a follow up visit. She was last seen in this clinic on 12/05/2023 by Dr. Idolina Maker for evaluation of allergic rhinitis. She began allergen immunotherapy directed toward grass pollen, weed pollen, tree pollen, cat, and dog on 01/04/2024.   Discussed the use of AI scribe software for clinical note transcription with the patient, who gave verbal consent to proceed.  History of Present Illness      Drug Allergies:  Allergies  Allergen Reactions   Fluconazole Other (See Comments)   Omeprazole Other (See Comments)   Percocet [Oxycodone -Acetaminophen ]     Muscle spasms.     Physical Exam: There were no vitals taken for this visit.   Physical Exam  Diagnostics:    Assessment and Plan: No diagnosis found.  No orders of the defined types were placed in this encounter.   There are no Patient Instructions on file for this visit.  No follow-ups on file.    Thank you for the opportunity to care for this patient.  Please do not hesitate to contact me with questions.  Marinus Sic, FNP [facility]

## 2024-03-04 NOTE — Patient Instructions (Incomplete)
 Allergic rhinitis Continue allergen avoidance measures directed toward grass pollen, weed pollen, tree pollen, cat, and dog as listed below Continue an antihistamine once a day if needed for a runny nose or itch Consider saline nasal rinses as needed for nasal symptoms. Use this before any medicated nasal sprays for best result Continue allergen immunotherapy and have access to an EpiPen  per protocol  Call the clinic if this treatment plan is not working well for you  Follow up in *** or sooner if needed.  Reducing Pollen Exposure The American Academy of All[employer] , Asthma and Immun[employer] suggests the following steps to reduce your exposure to pollen during allergy  seasons. Do not hang sheets or clothing out to dry; pollen may collect on these items. Do not mow lawns or spend time around freshly cut grass; mowing stirs up pollen. Keep windows closed at night.  Keep car windows closed while driving. Minimize morning activities outdoors, a time when pollen counts are usually at their highest. Stay indoors as much as possible when pollen counts or humidity is high and on windy days when pollen tends to remain in the air longer. Use air conditioning when possible.  Many air conditioners have filters that trap the pollen spores. Use a HEPA room air filter to remove pollen form the indoor air you breathe.  Control of Dog or Cat Allergen Avoidance is the best way to manage a dog or cat allergy . If you have a dog or cat and are allergic to dog or cats, consider removing the dog or cat from the home. If you have a dog or cat but don't want to find it a new home, or if your family wants a pet even though someone in the household is allergic, here are some strategies that may help keep symptoms at bay:  Keep the pet out of your bedroom and restrict it to only a few rooms. Be advised that keeping the dog or cat in only one room will not limit the allergens to that room. Don't pet, hug or kiss the dog or  cat; if you do, wash your hands with soap and water. High-efficiency particulate air (HEPA) cleaners run continuously in a bedroom or living room can reduce allergen levels over time. Regular use of a high-efficiency vacuum cleaner or a central vacuum can reduce allergen levels. Giving your dog or cat a bath at least once a week can reduce airborne allergen.

## 2024-03-05 ENCOUNTER — Encounter: Payer: Self-pay | Admitting: Family Medicine

## 2024-03-05 ENCOUNTER — Ambulatory Visit: Payer: BC Managed Care – PPO | Admitting: Family Medicine

## 2024-03-05 VITALS — BP 156/104 | HR 86 | Temp 97.7°F | Resp 16 | Wt 159.4 lb

## 2024-03-05 DIAGNOSIS — J309 Allergic rhinitis, unspecified: Secondary | ICD-10-CM

## 2024-03-05 DIAGNOSIS — H1010 Acute atopic conjunctivitis, unspecified eye: Secondary | ICD-10-CM

## 2024-03-05 DIAGNOSIS — H1013 Acute atopic conjunctivitis, bilateral: Secondary | ICD-10-CM | POA: Diagnosis not present

## 2024-03-07 ENCOUNTER — Ambulatory Visit (INDEPENDENT_AMBULATORY_CARE_PROVIDER_SITE_OTHER): Payer: Self-pay

## 2024-03-07 DIAGNOSIS — J309 Allergic rhinitis, unspecified: Secondary | ICD-10-CM | POA: Diagnosis not present

## 2024-03-14 ENCOUNTER — Ambulatory Visit (INDEPENDENT_AMBULATORY_CARE_PROVIDER_SITE_OTHER): Payer: Self-pay

## 2024-03-14 DIAGNOSIS — J309 Allergic rhinitis, unspecified: Secondary | ICD-10-CM | POA: Diagnosis not present

## 2024-03-19 ENCOUNTER — Ambulatory Visit (INDEPENDENT_AMBULATORY_CARE_PROVIDER_SITE_OTHER): Payer: Self-pay

## 2024-03-19 DIAGNOSIS — J309 Allergic rhinitis, unspecified: Secondary | ICD-10-CM

## 2024-03-26 ENCOUNTER — Ambulatory Visit (INDEPENDENT_AMBULATORY_CARE_PROVIDER_SITE_OTHER)

## 2024-03-26 DIAGNOSIS — J309 Allergic rhinitis, unspecified: Secondary | ICD-10-CM | POA: Diagnosis not present

## 2024-04-04 ENCOUNTER — Ambulatory Visit (INDEPENDENT_AMBULATORY_CARE_PROVIDER_SITE_OTHER): Payer: Self-pay

## 2024-04-04 DIAGNOSIS — J309 Allergic rhinitis, unspecified: Secondary | ICD-10-CM | POA: Diagnosis not present

## 2024-04-09 ENCOUNTER — Ambulatory Visit (INDEPENDENT_AMBULATORY_CARE_PROVIDER_SITE_OTHER): Payer: Self-pay

## 2024-04-09 DIAGNOSIS — J309 Allergic rhinitis, unspecified: Secondary | ICD-10-CM

## 2024-04-16 ENCOUNTER — Ambulatory Visit (INDEPENDENT_AMBULATORY_CARE_PROVIDER_SITE_OTHER): Payer: Self-pay

## 2024-04-16 DIAGNOSIS — J309 Allergic rhinitis, unspecified: Secondary | ICD-10-CM | POA: Diagnosis not present

## 2024-04-29 DIAGNOSIS — J3089 Other allergic rhinitis: Secondary | ICD-10-CM | POA: Diagnosis not present

## 2024-04-29 NOTE — Progress Notes (Signed)
 VIALS MADE 04-29-24

## 2024-04-30 ENCOUNTER — Ambulatory Visit (INDEPENDENT_AMBULATORY_CARE_PROVIDER_SITE_OTHER): Payer: Self-pay

## 2024-04-30 DIAGNOSIS — J309 Allergic rhinitis, unspecified: Secondary | ICD-10-CM | POA: Diagnosis not present

## 2024-05-01 DIAGNOSIS — J3081 Allergic rhinitis due to animal (cat) (dog) hair and dander: Secondary | ICD-10-CM | POA: Diagnosis not present

## 2024-05-07 ENCOUNTER — Ambulatory Visit (INDEPENDENT_AMBULATORY_CARE_PROVIDER_SITE_OTHER): Payer: Self-pay

## 2024-05-07 DIAGNOSIS — J309 Allergic rhinitis, unspecified: Secondary | ICD-10-CM

## 2024-05-16 ENCOUNTER — Ambulatory Visit (INDEPENDENT_AMBULATORY_CARE_PROVIDER_SITE_OTHER)

## 2024-05-16 DIAGNOSIS — J309 Allergic rhinitis, unspecified: Secondary | ICD-10-CM | POA: Diagnosis not present

## 2024-05-21 ENCOUNTER — Ambulatory Visit (INDEPENDENT_AMBULATORY_CARE_PROVIDER_SITE_OTHER): Payer: Self-pay

## 2024-05-21 DIAGNOSIS — J309 Allergic rhinitis, unspecified: Secondary | ICD-10-CM | POA: Diagnosis not present

## 2024-05-30 ENCOUNTER — Ambulatory Visit (INDEPENDENT_AMBULATORY_CARE_PROVIDER_SITE_OTHER): Payer: Self-pay

## 2024-05-30 DIAGNOSIS — J309 Allergic rhinitis, unspecified: Secondary | ICD-10-CM

## 2024-06-13 ENCOUNTER — Ambulatory Visit (INDEPENDENT_AMBULATORY_CARE_PROVIDER_SITE_OTHER): Payer: Self-pay

## 2024-06-13 DIAGNOSIS — J309 Allergic rhinitis, unspecified: Secondary | ICD-10-CM | POA: Diagnosis not present

## 2024-06-27 ENCOUNTER — Ambulatory Visit (INDEPENDENT_AMBULATORY_CARE_PROVIDER_SITE_OTHER)

## 2024-06-27 DIAGNOSIS — J309 Allergic rhinitis, unspecified: Secondary | ICD-10-CM

## 2024-07-04 ENCOUNTER — Ambulatory Visit (INDEPENDENT_AMBULATORY_CARE_PROVIDER_SITE_OTHER)

## 2024-07-04 DIAGNOSIS — J309 Allergic rhinitis, unspecified: Secondary | ICD-10-CM | POA: Diagnosis not present

## 2024-07-11 ENCOUNTER — Ambulatory Visit (INDEPENDENT_AMBULATORY_CARE_PROVIDER_SITE_OTHER)

## 2024-07-11 DIAGNOSIS — J309 Allergic rhinitis, unspecified: Secondary | ICD-10-CM

## 2024-08-08 ENCOUNTER — Ambulatory Visit (INDEPENDENT_AMBULATORY_CARE_PROVIDER_SITE_OTHER)

## 2024-08-08 DIAGNOSIS — J309 Allergic rhinitis, unspecified: Secondary | ICD-10-CM | POA: Diagnosis not present

## 2024-09-10 ENCOUNTER — Ambulatory Visit (INDEPENDENT_AMBULATORY_CARE_PROVIDER_SITE_OTHER): Payer: Self-pay

## 2024-09-10 DIAGNOSIS — J309 Allergic rhinitis, unspecified: Secondary | ICD-10-CM | POA: Diagnosis not present

## 2024-09-15 DIAGNOSIS — J301 Allergic rhinitis due to pollen: Secondary | ICD-10-CM | POA: Diagnosis not present

## 2024-09-15 DIAGNOSIS — J3089 Other allergic rhinitis: Secondary | ICD-10-CM | POA: Diagnosis not present

## 2024-09-15 DIAGNOSIS — J302 Other seasonal allergic rhinitis: Secondary | ICD-10-CM | POA: Diagnosis not present

## 2024-09-15 DIAGNOSIS — J3081 Allergic rhinitis due to animal (cat) (dog) hair and dander: Secondary | ICD-10-CM | POA: Diagnosis not present

## 2024-09-15 NOTE — Progress Notes (Signed)
 VIALS MADE ON 09/15/24

## 2024-10-09 ENCOUNTER — Ambulatory Visit

## 2024-10-09 DIAGNOSIS — J309 Allergic rhinitis, unspecified: Secondary | ICD-10-CM

## 2024-11-13 ENCOUNTER — Ambulatory Visit (INDEPENDENT_AMBULATORY_CARE_PROVIDER_SITE_OTHER)

## 2024-11-13 DIAGNOSIS — J302 Other seasonal allergic rhinitis: Secondary | ICD-10-CM
